# Patient Record
Sex: Female | Born: 1995 | Hispanic: No | Marital: Married | Smoking: Never smoker
Health system: Southern US, Community
[De-identification: ages and names within clinical notes are randomized; demographics above are authoritative.]

## PROBLEM LIST (undated history)

## (undated) DIAGNOSIS — N941 Unspecified dyspareunia: Secondary | ICD-10-CM

## (undated) DIAGNOSIS — R102 Pelvic and perineal pain: Secondary | ICD-10-CM

## (undated) DIAGNOSIS — N930 Postcoital and contact bleeding: Secondary | ICD-10-CM

## (undated) DIAGNOSIS — R0789 Other chest pain: Secondary | ICD-10-CM

## (undated) DIAGNOSIS — Z789 Other specified health status: Secondary | ICD-10-CM

## (undated) DIAGNOSIS — N898 Other specified noninflammatory disorders of vagina: Secondary | ICD-10-CM

## (undated) DIAGNOSIS — R300 Dysuria: Secondary | ICD-10-CM

## (undated) DIAGNOSIS — O9102 Infection of nipple associated with the puerperium: Secondary | ICD-10-CM

## (undated) DIAGNOSIS — K5900 Constipation, unspecified: Secondary | ICD-10-CM

## (undated) DIAGNOSIS — B3789 Other sites of candidiasis: Secondary | ICD-10-CM

## (undated) DIAGNOSIS — N952 Postmenopausal atrophic vaginitis: Secondary | ICD-10-CM

## (undated) HISTORY — DX: Pelvic and perineal pain: R10.2

## (undated) HISTORY — DX: Other sites of candidiasis: B37.89

## (undated) HISTORY — DX: Postcoital and contact bleeding: N93.0

## (undated) HISTORY — DX: Infection of nipple associated with the puerperium: O91.02

## (undated) HISTORY — DX: Other specified noninflammatory disorders of vagina: N89.8

## (undated) HISTORY — DX: Constipation, unspecified: K59.00

## (undated) HISTORY — DX: Unspecified dyspareunia: N94.10

## (undated) HISTORY — DX: Dysuria: R30.0

## (undated) HISTORY — DX: Postmenopausal atrophic vaginitis: N95.2

## (undated) HISTORY — PX: WISDOM TOOTH EXTRACTION: SHX21

## (undated) HISTORY — DX: Other chest pain: R07.89

## (undated) HISTORY — PX: DIAGNOSTIC LAPAROSCOPY WITH REMOVAL OF ECTOPIC PREGNANCY: SHX6449

---

## 2017-03-05 LAB — OB RESULTS CONSOLE GC/CHLAMYDIA: GC PROBE AMP, GENITAL: NEGATIVE

## 2017-04-26 LAB — OB RESULTS CONSOLE HEPATITIS B SURFACE ANTIGEN: Hepatitis B Surface Ag: NEGATIVE

## 2017-04-26 LAB — OB RESULTS CONSOLE RUBELLA ANTIBODY, IGM: Rubella: IMMUNE

## 2017-04-26 LAB — OB RESULTS CONSOLE HIV ANTIBODY (ROUTINE TESTING): HIV: NONREACTIVE

## 2017-06-11 NOTE — L&D Delivery Note (Signed)
Delivery Note At 6:44 PM Alice viable and healthy female was delivered via Vaginal, Spontaneous (Presentation: vtx  ; ROA ).  APGAR: 8, 9; weight  7lb 0.7 oz.   Placenta status: spontaneous intact not sent. mec stained, .  Cord:  with the following complications: loop of cord next to chest .  Cord pH: none Anesthesia:  epidural Episiotomy: None Lacerations: right Vaginal;2nd degree perineal Suture Repair: 3.0 chromic Est. Blood Loss (mL): 300  Mom to postpartum.  Baby to Couplet care / Skin to Skin.  Alice [name] Alice [name] 11/13/2017, 7:49 PM

## 2017-08-30 LAB — OB RESULTS CONSOLE RPR: RPR: NONREACTIVE

## 2017-10-25 LAB — OB RESULTS CONSOLE GBS: STREP GROUP B AG: NEGATIVE

## 2017-11-13 ENCOUNTER — Inpatient Hospital Stay (HOSPITAL_COMMUNITY): Payer: PPO | Admitting: Anesthesiology

## 2017-11-13 ENCOUNTER — Inpatient Hospital Stay (HOSPITAL_COMMUNITY)
Admission: AD | Admit: 2017-11-13 | Discharge: 2017-11-15 | DRG: 807 | Disposition: A | Discharge status: Home / Self Care | Payer: PPO | Attending: Obstetrics and Gynecology | Admitting: Obstetrics and Gynecology

## 2017-11-13 ENCOUNTER — Encounter (HOSPITAL_COMMUNITY): Payer: Self-pay | Admitting: *Deleted

## 2017-11-13 ENCOUNTER — Other Ambulatory Visit: Payer: Self-pay

## 2017-11-13 DIAGNOSIS — Z3483 Encounter for supervision of other normal pregnancy, third trimester: Secondary | ICD-10-CM | POA: Diagnosis present

## 2017-11-13 DIAGNOSIS — Z3A39 39 weeks gestation of pregnancy: Secondary | ICD-10-CM | POA: Diagnosis not present

## 2017-11-13 DIAGNOSIS — O9902 Anemia complicating childbirth: Secondary | ICD-10-CM | POA: Diagnosis present

## 2017-11-13 LAB — CBC
HCT: 34.7 % — ABNORMAL LOW (ref 36.0–46.0)
Hemoglobin: 11.6 g/dL — ABNORMAL LOW (ref 12.0–15.0)
MCH: 28.1 pg (ref 26.0–34.0)
MCHC: 33.4 g/dL (ref 30.0–36.0)
MCV: 84 fL (ref 78.0–100.0)
PLATELETS: 223 10*3/uL (ref 150–400)
RBC: 4.13 MIL/uL (ref 3.87–5.11)
RDW: 14.9 % (ref 11.5–15.5)
WBC: 8.1 10*3/uL (ref 4.0–10.5)

## 2017-11-13 LAB — ABO/RH: ABO/RH(D): A POS

## 2017-11-13 LAB — URIC ACID: Uric Acid, Serum: 6 mg/dL (ref 2.3–6.6)

## 2017-11-13 LAB — RPR: RPR: NONREACTIVE

## 2017-11-13 LAB — TYPE AND SCREEN
ABO/RH(D): A POS
Antibody Screen: NEGATIVE

## 2017-11-13 MED ORDER — OXYTOCIN 40 UNITS IN LACTATED RINGERS INFUSION - SIMPLE MED
2.5000 [IU]/h | INTRAVENOUS | Status: DC
  Filled 2017-11-13: qty 1000

## 2017-11-13 MED ORDER — OXYTOCIN BOLUS FROM INFUSION
500.0000 mL | Freq: Once | INTRAVENOUS | Status: AC
  Administered 2017-11-13: 500 mL via INTRAVENOUS

## 2017-11-13 MED ORDER — LACTATED RINGERS IV SOLN
500.0000 mL | INTRAVENOUS | Status: DC | PRN
  Administered 2017-11-13 (×2): 500 mL via INTRAVENOUS

## 2017-11-13 MED ORDER — EPHEDRINE 5 MG/ML INJ
10.0000 mg | INTRAVENOUS | Status: DC | PRN
  Filled 2017-11-13: qty 2

## 2017-11-13 MED ORDER — ACETAMINOPHEN 325 MG PO TABS: 650.0000 mg | ORAL_TABLET | ORAL | Status: DC | PRN

## 2017-11-13 MED ORDER — ACETAMINOPHEN 325 MG PO TABS
650.0000 mg | ORAL_TABLET | ORAL | Status: DC | PRN
  Administered 2017-11-14: 650 mg via ORAL
  Filled 2017-11-13: qty 2

## 2017-11-13 MED ORDER — LACTATED RINGERS IV SOLN
500.0000 mL | Freq: Once | INTRAVENOUS | Status: AC
  Administered 2017-11-13: 500 mL via INTRAVENOUS

## 2017-11-13 MED ORDER — OXYCODONE HCL 5 MG PO TABS: 5.0000 mg | ORAL_TABLET | ORAL | Status: DC | PRN

## 2017-11-13 MED ORDER — DIPHENHYDRAMINE HCL 50 MG/ML IJ SOLN: 12.5000 mg | INTRAMUSCULAR | Status: DC | PRN

## 2017-11-13 MED ORDER — PHENYLEPHRINE 40 MCG/ML (10ML) SYRINGE FOR IV PUSH (FOR BLOOD PRESSURE SUPPORT)
PREFILLED_SYRINGE | INTRAVENOUS | Status: AC
  Filled 2017-11-13: qty 20

## 2017-11-13 MED ORDER — PHENYLEPHRINE 40 MCG/ML (10ML) SYRINGE FOR IV PUSH (FOR BLOOD PRESSURE SUPPORT)
80.0000 ug | PREFILLED_SYRINGE | INTRAVENOUS | Status: DC | PRN
  Filled 2017-11-13: qty 5

## 2017-11-13 MED ORDER — DIPHENHYDRAMINE HCL 25 MG PO CAPS: 25.0000 mg | ORAL_CAPSULE | Freq: Four times a day (QID) | ORAL | Status: DC | PRN

## 2017-11-13 MED ORDER — SOD CITRATE-CITRIC ACID 500-334 MG/5ML PO SOLN: 30.0000 mL | ORAL | Status: DC | PRN

## 2017-11-13 MED ORDER — FENTANYL 2.5 MCG/ML BUPIVACAINE 1/10 % EPIDURAL INFUSION (WH - ANES)
INTRAMUSCULAR | Status: AC
  Filled 2017-11-13: qty 100

## 2017-11-13 MED ORDER — TERBUTALINE SULFATE 1 MG/ML IJ SOLN
0.2500 mg | Freq: Once | INTRAMUSCULAR | Status: DC | PRN
  Filled 2017-11-13: qty 1

## 2017-11-13 MED ORDER — SENNOSIDES-DOCUSATE SODIUM 8.6-50 MG PO TABS
2.0000 | ORAL_TABLET | ORAL | Status: DC
  Administered 2017-11-14 (×2): 2 via ORAL
  Filled 2017-11-13 (×2): qty 2

## 2017-11-13 MED ORDER — OXYCODONE HCL 5 MG PO TABS: 10.0000 mg | ORAL_TABLET | ORAL | Status: DC | PRN

## 2017-11-13 MED ORDER — OXYTOCIN 40 UNITS IN LACTATED RINGERS INFUSION - SIMPLE MED
1.0000 m[IU]/min | INTRAVENOUS | Status: DC
  Administered 2017-11-13: 2 m[IU]/min via INTRAVENOUS

## 2017-11-13 MED ORDER — ZOLPIDEM TARTRATE 5 MG PO TABS: 5.0000 mg | ORAL_TABLET | Freq: Every evening | ORAL | Status: DC | PRN

## 2017-11-13 MED ORDER — SIMETHICONE 80 MG PO CHEW: 80.0000 mg | CHEWABLE_TABLET | ORAL | Status: DC | PRN

## 2017-11-13 MED ORDER — COCONUT OIL OIL
1.0000 "application " | TOPICAL_OIL | Status: DC | PRN
  Filled 2017-11-13: qty 120

## 2017-11-13 MED ORDER — LIDOCAINE HCL (PF) 1 % IJ SOLN
30.0000 mL | INTRAMUSCULAR | Status: AC | PRN
  Administered 2017-11-13: 30 mL via SUBCUTANEOUS
  Filled 2017-11-13: qty 30

## 2017-11-13 MED ORDER — FENTANYL 2.5 MCG/ML BUPIVACAINE 1/10 % EPIDURAL INFUSION (WH - ANES)
14.0000 mL/h | INTRAMUSCULAR | Status: DC | PRN
  Administered 2017-11-13 (×2): 14 mL/h via EPIDURAL
  Filled 2017-11-13: qty 100

## 2017-11-13 MED ORDER — LIDOCAINE HCL (PF) 1 % IJ SOLN
INTRAMUSCULAR | Status: DC | PRN
  Administered 2017-11-13: 8 mL via EPIDURAL

## 2017-11-13 MED ORDER — IBUPROFEN 600 MG PO TABS
600.0000 mg | ORAL_TABLET | Freq: Four times a day (QID) | ORAL | Status: DC
  Administered 2017-11-13 – 2017-11-15 (×8): 600 mg via ORAL
  Filled 2017-11-13 (×9): qty 1

## 2017-11-13 MED ORDER — PRENATAL MULTIVITAMIN CH
1.0000 | ORAL_TABLET | Freq: Every day | ORAL | Status: DC
  Administered 2017-11-14 – 2017-11-15 (×2): 1 via ORAL
  Filled 2017-11-13 (×2): qty 1

## 2017-11-13 MED ORDER — OXYCODONE-ACETAMINOPHEN 5-325 MG PO TABS: 2.0000 | ORAL_TABLET | ORAL | Status: DC | PRN

## 2017-11-13 MED ORDER — FERROUS SULFATE 325 (65 FE) MG PO TABS
325.0000 mg | ORAL_TABLET | Freq: Two times a day (BID) | ORAL | Status: DC
  Administered 2017-11-14: 325 mg via ORAL
  Filled 2017-11-13: qty 1

## 2017-11-13 MED ORDER — BENZOCAINE-MENTHOL 20-0.5 % EX AERO
1.0000 "application " | INHALATION_SPRAY | CUTANEOUS | Status: DC | PRN
  Administered 2017-11-14: 1 via TOPICAL
  Filled 2017-11-13 (×2): qty 56

## 2017-11-13 MED ORDER — OXYCODONE-ACETAMINOPHEN 5-325 MG PO TABS: 1.0000 | ORAL_TABLET | ORAL | Status: DC | PRN

## 2017-11-13 MED ORDER — WITCH HAZEL-GLYCERIN EX PADS: 1.0000 "application " | MEDICATED_PAD | CUTANEOUS | Status: DC | PRN

## 2017-11-13 MED ORDER — ONDANSETRON HCL 4 MG/2ML IJ SOLN: 4.0000 mg | INTRAMUSCULAR | Status: DC | PRN

## 2017-11-13 MED ORDER — ONDANSETRON HCL 4 MG PO TABS: 4.0000 mg | ORAL_TABLET | ORAL | Status: DC | PRN

## 2017-11-13 MED ORDER — LACTATED RINGERS IV SOLN
INTRAVENOUS | Status: DC
  Administered 2017-11-13 (×4): via INTRAVENOUS

## 2017-11-13 MED ORDER — DIBUCAINE 1 % RE OINT
1.0000 "application " | TOPICAL_OINTMENT | RECTAL | Status: DC | PRN
  Filled 2017-11-13: qty 28

## 2017-11-13 NOTE — Anesthesia Pain Management Evaluation Note (Signed)
  CRNA Pain Management Visit Note  Patient: Alice [name], 22 y.o., female  "Hello I am a member of the anesthesia team at [facility]. We have an anesthesia team available at all times to provide care throughout the hospital, including epidural management and anesthesia for C-section. I don't know your plan for the delivery whether it a natural birth, water birth, IV sedation, nitrous supplementation, doula or epidural, but we want to meet your pain goals."   1.Was your pain managed to your expectations on prior hospitalizations?   No prior hospitalizations  2.What is your expectation for pain management during this hospitalization?     Epidural  3.How can we help you reach that goal?   Record the patient's initial score and the patient's pain goal.   Pain: 8  Pain Goal: 3 [facility] wants you to be able to say your pain was always managed very well.  Alice [name],Alice [name] 11/13/2017

## 2017-11-13 NOTE — Anesthesia Preprocedure Evaluation (Signed)

## 2017-11-13 NOTE — MAU Note (Signed)
PT SAYS  UC STRONG AT 0200.    VE IN OFFICE-   2 CM YESTERDAY.   GBS- NEG.   DENIES HSV.

## 2017-11-13 NOTE — Anesthesia Procedure Notes (Signed)
Epidural Patient location during procedure: OB Start time: 11/13/2017 7:50 AM End time: 11/13/2017 7:54 AM  Staffing Anesthesiologist: Bethena Midgetddono, Elisandro Jarrett, MD  Preanesthetic Checklist Completed: patient identified, site marked, surgical consent, pre-op evaluation, timeout performed, IV checked, risks and benefits discussed and monitors and equipment checked  Epidural Patient position: sitting Prep: site prepped and draped and DuraPrep Patient monitoring: continuous pulse ox and blood pressure Approach: midline Location: L4-L5 Injection technique: LOR air  Needle:  Needle type: Tuohy  Needle gauge: 17 G Needle length: 9 cm and 9 Needle insertion depth: 5 cm Catheter type: closed end flexible Catheter size: 19 Gauge Catheter at skin depth: 10 cm Test dose: negative  Assessment Events: blood not aspirated, injection not painful, no injection resistance, negative IV test and no paresthesia

## 2017-11-13 NOTE — Progress Notes (Signed)
Alice [name] is a 22 y.o. G1P0 at 331w2d by sono.   Subjective: UCs painful since 2 am   Objective: BP (!) 122/53   Pulse 80   Temp 98.8 F (37.1 C) (Oral)   Resp 16   Ht 5\' 2"  (1.575 m)   Wt 139 lb 4 oz (63.2 kg)   BMI 25.47 kg/m   FHT:  FHR: 150 bpm, variability: moderate,  accelerations:  Present,  decelerations:  Absent UC:   regular, every 3-4 minutes SVE:   Dilation: 6 Effacement (%): 50 Station: -3 Exam by:: Dr. Juliene PinaMody Controlled AROM, clear fluid, high unflexed head  Labs: Lab Results  Component Value Date   WBC 8.1 11/13/2017   HGB 11.6 (L) 11/13/2017   HCT 34.7 (L) 11/13/2017   MCV 84.0 11/13/2017   PLT 223 11/13/2017    Assessment / Plan: Spontaneous labor, progressing normally  Labor: Progressing normally Fetal Wellbeing:  Category I Pain Control:  Epidural per request  I/D:  n/a Anticipated MOD:  NSVD, guarded until head descent noted.   Alice [name] 11/13/2017, 7:14 AM

## 2017-11-13 NOTE — Progress Notes (Signed)
Alice [name] is a 22 y.o. G1P0 at 4523w2d by sono.   Subjective: S/p epidural   Objective: BP 113/67   Pulse 76   Temp 98.5 F (36.9 C) (Oral)   Resp 16   Ht 5\' 2"  (1.575 m)   Wt 139 lb 4 oz (63.2 kg)   SpO2 98%   BMI 25.47 kg/m   FHT:  FHR: 150 bpm, variability: moderate,  accelerations:  Present,  decelerations:  Absent UC:   regular, every 3-4 minutes SVE:   Dilation: 6 Effacement (%): 80 Station: -2 Exam by:: A.Anthony,RN Unchanged exam Left rotation.   Assessment / Plan: Spontaneous labor, progressing normally  Labor: Progressing normally Fetal Wellbeing:  Category I Pain Control:  Epidural I/D:  n/a Anticipated MOD:  NSVD, guarded until head descent noted.   Alice [name] 11/13/2017, 8:40 AM

## 2017-11-13 NOTE — H&P (Signed)
Alice [name] is a 22 y.o. female G1 39.2 wks. Presents for labor. Bloody show. GBS(-) Uncomplicated prenatal care, from 10 wks. Labs normal.  . OB History    Gravida  1   Para      Term      Preterm      AB      Living        SAB      TAB      Ectopic      Multiple      Live Births             History reviewed. No pertinent past medical history. Past Surgical History:  Procedure Laterality Date  . WISDOM TOOTH EXTRACTION     Family History: family history includes Diabetes in her maternal grandmother. Social History:  reports that she has never smoked. She has never used smokeless tobacco. She reports that she does not drink alcohol or use drugs.     Maternal Diabetes: No Genetic Screening: Normal Maternal Ultrasounds/Referrals: Normal Fetal Ultrasounds or other Referrals:  None Maternal Substance Abuse:  No Significant Maternal Medications:  None Significant Maternal Lab Results:  Lab values include: Group B Strep negative Other Comments:  None  ROS History Dilation: 5 Effacement (%): 50 Station: -3 Exam by:: Everlene FarrierHilary Hardy, RN  Blood pressure 121/74, pulse 86, temperature 98.5 F (36.9 C), resp. rate 16, height 5\' 2"  (1.575 m), weight 139 lb 4 oz (63.2 kg). Exam Physical Exam  BP 121/74 (BP Location: Right Arm)   Pulse 86   Temp 98.5 F (36.9 C)   Resp 16   Ht 5\' 2"  (1.575 m)   Wt 139 lb 4 oz (63.2 kg)   BMI 25.47 kg/m  Physical exam:  A&O x 3, no acute distress. Pleasant HEENT neg, no thyromegaly Lungs CTA bilat CV RRR, S1S2 normal Abdo soft, non tender, non acute Extr no edema/ tenderness Pelvic above 5/80%/-3/ Vx/ intact, bloody show FHT  130s + accels no decels mod variability category I Toco q 3-4 min  Prenatal labs: ABO, Rh:  A(+) Antibody:  Neg Rubella:  Imm RPR:   NR HBsAg:   Neg HIV:   Neg GBS:   Neg Glucola neg Ultrascreen neg   Assessment/Plan: 22 yo female G1, 39.2 wks, in active labor. FHT cat I Routine labor  admit. CEFM, ambulate, plan AROM after admit to L&D, epidural desired. Anticipate SVD  Robley FriesVaishali R Ghalia Reicks 11/13/2017, 5:21 AM

## 2017-11-13 NOTE — Progress Notes (Signed)
Alice [name] is a 22 y.o. G1P0 at 102110w2d by LMP admitted for active labor  Subjective: Chief Complaint  Patient presents with  . Labor Eval  S: epidural Notes ctx  Objective: BP 107/61   Pulse 85   Temp 99.8 F (37.7 C) (Oral)   Resp 16   Ht 5\' 2"  (1.575 m)   Wt 63.2 kg (139 lb 4 oz)   SpO2 98%   BMI 25.47 kg/m  No intake/output data recorded. No intake/output data recorded.  FHT:  FHR: 140 bpm, variability: moderate,  accelerations:  Present,  decelerations:  Absent UC:   ? Frequency ( q2-4 mins) SVE:   9/100/-2/-1 asynclitic small caput Tracing: cat 1 IUPC placed Labs: Lab Results  Component Value Date   WBC 8.1 11/13/2017   HGB 11.6 (L) 11/13/2017   HCT 34.7 (L) 11/13/2017   MCV 84.0 11/13/2017   PLT 223 11/13/2017    Assessment / Plan: Arrest in active phase of labor  Unclear if related to sub optimal ctx. Pt and husband frustrated due to same exam for 4 hrs per husband Advised if ctx optimal for 2 hr and no change, then  Need C/S. If poor ctx then will start pitocin and reexamine After adequate x 2 hr P) right exaggerated sims. Start pitocin if sub optimal ctx . Recheck 2hr after adequate labor   Anticipated MOD:  guarded  Ghassan Coggeshall A Delawrence Fridman 11/13/2017, 3:17 PM

## 2017-11-14 ENCOUNTER — Encounter (HOSPITAL_COMMUNITY): Payer: Self-pay | Admitting: Student

## 2017-11-14 DIAGNOSIS — O9902 Anemia complicating childbirth: Secondary | ICD-10-CM

## 2017-11-14 HISTORY — DX: Anemia complicating childbirth: O99.02

## 2017-11-14 LAB — CBC
HEMATOCRIT: 27.9 % — AB (ref 36.0–46.0)
HEMOGLOBIN: 9.3 g/dL — AB (ref 12.0–15.0)
MCH: 28.1 pg (ref 26.0–34.0)
MCHC: 33.3 g/dL (ref 30.0–36.0)
MCV: 84.3 fL (ref 78.0–100.0)
Platelets: 158 10*3/uL (ref 150–400)
RBC: 3.31 MIL/uL — AB (ref 3.87–5.11)
RDW: 15 % (ref 11.5–15.5)
WBC: 16.5 10*3/uL — ABNORMAL HIGH (ref 4.0–10.5)

## 2017-11-14 MED ORDER — MAGNESIUM OXIDE 400 (241.3 MG) MG PO TABS
400.0000 mg | ORAL_TABLET | Freq: Every day | ORAL | Status: DC
  Administered 2017-11-14 – 2017-11-15 (×2): 400 mg via ORAL
  Filled 2017-11-14 (×3): qty 1

## 2017-11-14 MED ORDER — POLYSACCHARIDE IRON COMPLEX 150 MG PO CAPS
150.0000 mg | ORAL_CAPSULE | Freq: Every day | ORAL | Status: DC
  Administered 2017-11-15: 150 mg via ORAL
  Filled 2017-11-14: qty 1

## 2017-11-14 NOTE — Lactation Note (Signed)
This note was copied from a baby's chart. Lactation Consultation Note  Patient Name: Alice [name] Reason for consult: Follow-up assessment;1st time breastfeeding   Follow up with mom of 21 hours at RN request. Infant has been sleepy today and not feeding actively. Infant with 5 BF for 10-20 minutes, 3 BF attempts, 1 void and 4 stools since birth. Infant weight 6 pounds 13.5 ounces with 3% weight loss since birth.   RN had infant latched to the left breast in the football hold. Infant actively feeding with good swallows. Infant needed some stimulation as feeding progressed. Enc mom to massage/compress breast with feedings as well as stimulating infant as needed to maintain suckling. Mom very tired and needed reminding about keeping infant awake with feeding. Enc mom to sleep when infant sleeps. Infant self detached and nipple was round post BF. Mom denied nipple pain with feeding, she was experiencing uterine cramping with feeding.   Enc mom to feed infant STS 8-12 x in 24 hours at first feeding cues. Discussed normalcy of cluster feeding and would be likely that infant will cluster feed tonight.  Enc mom to call out for feeding assistance as needed. Dad concerned infant spitting up to an hour post feeding. Enc parents to burp infant post each feeding and may be helpful to keep infant upright post feeding. Dad is concerned infant is fragile and is better off laying flat, Discussed it is ok to hold infant upright after feeding.   Parents report all questions/concerns have been answered. Parents to call out for feeding assistance as needed.      Maternal Data Formula Feeding for Exclusion: No Does the patient have breastfeeding experience prior to this delivery?: No  Feeding Feeding Type: Breast Fed Length of feed: 20 min  LATCH Score Latch: Repeated attempts needed to sustain latch, nipple held in mouth throughout feeding, stimulation needed to elicit sucking  reflex.  Audible Swallowing: Spontaneous and intermittent  Type of Nipple: Everted at rest and after stimulation  Comfort (Breast/Nipple): Soft / non-tender  Hold (Positioning): Assistance needed to correctly position infant at breast and maintain latch.  LATCH Score: 8  Interventions    Lactation Tools Discussed/Used     Consult Status Consult Status: Follow-up Date: 11/15/17 Follow-up type: In-patient    Alice [name] [name], 4:37 PM

## 2017-11-14 NOTE — Lactation Note (Signed)
This note was copied from a baby's chart. Lactation Consultation Note Baby 9 hrs old. Baby hasn't fed much.  Mom has med/large nipples. Short shaft everted nipples. Everts more w/stimulation. Areola w/slight edema. Reverse pressure to soften and compressible. Shells given and placed in bra. Hand pump given to pre-or post pump. Mom has large cone shaped breast. Feels heavy to LC, mom states no different. Discussed filling, engorgement, pumping, hand expression, milk storage.  Baby resting in crib, acting like will have emesis. Sat baby upright. No emesis. Discussed newborn behavior, feeding habits, I&O, STS, cluster feeding, supply and demand. Noted baby has recessed chin. Discussed chin tug after latching. Mom encouraged to feed baby 8-12 times/24 hours and with feeding cues. Mom encouraged to waken baby for feeds if hasn't cued in 3 hrs. Answered a lot of questions parents had. encouraged to call for assistance or further questions.  WH/LC brochure given w/resources, support groups and LC services.  Patient Name: Boy Luz LexRazan Neyens ZOXWR'UToday's Date: 11/14/2017 Reason for consult: Initial assessment   Maternal Data Has patient been taught Hand Expression?: Yes Does the patient have breastfeeding experience prior to this delivery?: No  Feeding Feeding Type: Breast Fed  LATCH Score Latch: Too sleepy or reluctant, no latch achieved, no sucking elicited.  Audible Swallowing: None  Type of Nipple: Everted at rest and after stimulation  Comfort (Breast/Nipple): Soft / non-tender  Hold (Positioning): Assistance needed to correctly position infant at breast and maintain latch.  LATCH Score: 6  Interventions Interventions: Breast feeding basics reviewed;Support pillows;Skin to skin;Expressed milk;Breast massage;Hand express;Shells;Pre-pump if needed;Reverse pressure;Hand pump;Breast compression  Lactation Tools Discussed/Used Tools: Shells;Pump Shell Type: Inverted Breast pump type:  Manual WIC Program: No Pump Review: Setup, frequency, and cleaning;Milk Storage Initiated by:: Peri JeffersonL. Aleea Hendry RNIBCLC Date initiated:: 11/14/17   Consult Status Consult Status: Follow-up Date: 11/14/17 Follow-up type: In-patient    Dymond Gutt, Diamond NickelLAURA G 11/14/2017, 3:51 AM

## 2017-11-14 NOTE — Progress Notes (Signed)
PPD # 1 SVD Information for the patient's newborn:  Alice [name], Boy Alice [name] [[id number]][[id number]]  female      breast feeding  / Circumcision undecided Baby name: undecided  S:  Reports feeling tired but well.             Tolerating po/ No nausea or vomiting             Bleeding is decreased             Pain controlled with ibuprofen (OTC)             Up ad lib / ambulatory / voiding without difficulties        O:  A & O x 3, in no apparent distress              VS:  Vitals:   11/13/17 2148 11/13/17 2237 11/14/17 0215 11/14/17 0815  BP: 113/66 102/74 106/60 (!) 98/48  Pulse: 79 (!) 103 70 75  Resp: 18 17 16 18   Temp: 98.9 F (37.2 C) 99.6 F (37.6 C) 98.2 F (36.8 C) 98 F (36.7 C)  TempSrc: Oral Oral Oral Oral  SpO2: 100% 99% 100%   Weight:      Height:        LABS:  Recent Labs    11/13/17 0536 11/14/17 0609  WBC 8.1 16.5*  HGB 11.6* 9.3*  HCT 34.7* 27.9*  PLT 223 158    Blood type: --/--/A POS, A POS Performed at [facility], [address] [place], [place] [address]  [phone](06/05 [id number])  Rubella: Immune (11/16 0000)   I&O: I/O last 3 completed shifts: In: -  Out: 1100 [Urine:800; Blood:300]          No intake/output data recorded.  Lungs: Clear and unlabored  Heart: regular rate and rhythm / no murmurs  Abdomen: soft, non-tender, non-distended             Fundus: firm, non-tender, U-1  Perineum: repair intact  Lochia: small  Extremities: no edema, no calf pain or tenderness    A/P: PPD # 1 22 y.o., G1P1001   Principal Problem:   Postpartum care following vaginal delivery (6/5) Active Problems:   SVD (spontaneous vaginal delivery)   Second-degree perineal laceration, with delivery   Doing well - stable status  Routine post partum orders  Anticipate discharge tomorrow    Alice Mendsaniela C Makynleigh Breslin, MSN, CNM 11/14/2017, 1:33 PM

## 2017-11-14 NOTE — Anesthesia Postprocedure Evaluation (Signed)
Anesthesia Post Note  Patient: Alice [name]  Procedure(s) Performed: AN AD HOC LABOR EPIDURAL     Patient location during evaluation: Mother Baby Anesthesia Type: Epidural Level of consciousness: awake and alert and oriented Pain management: satisfactory to patient Vital Signs Assessment: post-procedure vital signs reviewed and stable Respiratory status: spontaneous breathing and nonlabored ventilation Cardiovascular status: stable Postop Assessment: no headache, no backache, no signs of nausea or vomiting, adequate PO intake and patient able to bend at knees (patient up walking) Anesthetic complications: no    Last Vitals:  Vitals:   11/14/17 0215 11/14/17 0815  BP: 106/60 (!) 98/48  Pulse: 70 75  Resp: 16 18  Temp: 36.8 C 36.7 C  SpO2: 100%     Last Pain:  Vitals:   11/14/17 0815  TempSrc: Oral  PainSc: 0-No pain   Pain Goal: Patients Stated Pain Goal: 6 (11/13/17 0550)               Madison HickmanGREGORY,Surya Schroeter

## 2017-11-15 MED ORDER — COCONUT OIL OIL: 1.0000 "application " | TOPICAL_OIL | 0 refills | Status: DC | PRN

## 2017-11-15 MED ORDER — DIBUCAINE 1 % RE OINT: 1.0000 "application " | TOPICAL_OINTMENT | RECTAL | 0 refills | Status: DC | PRN

## 2017-11-15 MED ORDER — WITCH HAZEL-GLYCERIN EX PADS: 1.0000 | MEDICATED_PAD | CUTANEOUS | 12 refills | Status: DC | PRN

## 2017-11-15 MED ORDER — IBUPROFEN 600 MG PO TABS: 600.0000 mg | ORAL_TABLET | Freq: Four times a day (QID) | ORAL | 0 refills | Status: DC

## 2017-11-15 NOTE — Discharge Instructions (Signed)
Postpartum Care After Vaginal Delivery °The period of time right after you deliver your newborn is called the postpartum period. °What kind of medical care will I receive? °· You may continue to receive fluids and medicines through an IV tube inserted into one of your veins. °· If an incision was made near your vagina (episiotomy) or if you had some vaginal tearing during delivery, cold compresses may be placed on your episiotomy or your tear. This helps to reduce pain and swelling. °· You may be given a squirt bottle to use when you go to the bathroom. You may use this until you are comfortable wiping as usual. To use the squirt bottle, follow these steps: °? Before you urinate, fill the squirt bottle with warm water. Do not use hot water. °? After you urinate, while you are sitting on the toilet, use the squirt bottle to rinse the area around your urethra and vaginal opening. This rinses away any urine and blood. °? You may do this instead of wiping. As you start healing, you may use the squirt bottle before wiping yourself. Make sure to wipe gently. °? Fill the squirt bottle with clean water every time you use the bathroom. °· You will be given sanitary pads to wear. °How can I expect to feel? °· You may not feel the need to urinate for several hours after delivery. °· You will have some soreness and pain in your abdomen and vagina. °· If you are breastfeeding, you may have uterine contractions every time you breastfeed for up to several weeks postpartum. Uterine contractions help your uterus return to its normal size. °· It is normal to have vaginal bleeding (lochia) after delivery. The amount and appearance of lochia is often similar to a menstrual period in the first week after delivery. It will gradually decrease over the next few weeks to a dry, yellow-brown discharge. For most women, lochia stops completely by 6-8 weeks after delivery. Vaginal bleeding can vary from woman to woman. °· Within the first few  days after delivery, you may have breast engorgement. This is when your breasts feel heavy, full, and uncomfortable. Your breasts may also throb and feel hard, tightly stretched, warm, and tender. After this occurs, you may have milk leaking from your breasts. Your health care provider can help you relieve discomfort due to breast engorgement. Breast engorgement should go away within a few days. °· You may feel more sad or worried than normal due to hormonal changes after delivery. These feelings should not last more than a few days. If these feelings do not go away after several days, speak with your health care provider. °How should I care for myself? °· Tell your health care provider if you have pain or discomfort. °· Drink enough water to keep your urine clear or pale yellow. °· Wash your hands thoroughly with soap and water for at least 20 seconds after changing your sanitary pads, after using the toilet, and before holding or feeding your baby. °· If you are not breastfeeding, avoid touching your breasts a lot. Doing this can make your breasts produce more milk. °· If you become weak or lightheaded, or you feel like you might faint, ask for help before: °? Getting out of bed. °? Showering. °· Change your sanitary pads frequently. Watch for any changes in your flow, such as a sudden increase in volume, a change in color, the passing of large blood clots. If you pass a blood clot from your vagina, save it   to show to your health care provider. Do not flush blood clots down the toilet without having your health care provider look at them. °· Make sure that all your vaccinations are up to date. This can help protect you and your baby from getting certain diseases. You may need to have immunizations done before you leave the hospital. °· If desired, talk with your health care provider about methods of family planning or birth control (contraception). °How can I start bonding with my baby? °Spending as much time as  possible with your baby is very important. During this time, you and your baby can get to know each other and develop a bond. Having your baby stay with you in your room (rooming in) can give you time to get to know your baby. Rooming in can also help you become comfortable caring for your baby. Breastfeeding can also help you bond with your baby. °How can I plan for returning home with my baby? °· Make sure that you have a car seat installed in your vehicle. °? Your car seat should be checked by a certified car seat installer to make sure that it is installed safely. °? Make sure that your baby fits into the car seat safely. °· Ask your health care provider any questions you have about caring for yourself or your baby. Make sure that you are able to contact your health care provider with any questions after leaving the hospital. °This information is not intended to replace advice given to you by your health care provider. Make sure you discuss any questions you have with your health care provider. °Document Released: 03/25/2007 Document Revised: 10/31/2015 Document Reviewed: 05/02/2015 °Elsevier Interactive Patient Education © 2018 [employer] ° °

## 2017-11-15 NOTE — Discharge Summary (Signed)
Obstetric Discharge Summary Reason for Admission: onset of labor Prenatal Procedures: ultrasound Intrapartum Procedures: spontaneous vaginal delivery Postpartum Procedures: none Complications-Operative and Postpartum: 2nd degree perineal laceration Hemoglobin  Date Value Ref Range Status  11/14/2017 9.3 (L) 12.0 - 15.0 g/dL Final   HCT  Date Value Ref Range Status  11/14/2017 27.9 (L) 36.0 - 46.0 % Final    Physical Exam:  General: alert, cooperative and appears stated age 32Lochia: appropriate Uterine Fundus: firm Incision: healing well DVT Evaluation: No evidence of DVT seen on physical exam.  Discharge Diagnoses: Term Pregnancy-delivered  Discharge Information: Date: 11/15/2017 Activity: pelvic rest Diet: routine Medications: Prenatal vitamins, iron Condition: stable Instructions: refer to practice specific booklet Discharge to: home Follow-up Information    Alice [name], Alice Bisig, MD Follow up in 4 week(s).   Specialty:  Obstetrics and Gynecology Why:  for postpartum check before you [place] Contact information: [place] [place] [address] [phone]           Newborn Data: Live born female  Birth Weight: 7 lb 0.7 oz (3196 g) APGAR: 8, 9  Newborn Delivery   Birth date/time:  11/13/2017 18:44:00 Delivery type:  Vaginal, Spontaneous     Home with mother.  Alice FriesVaishali R Aayushi [name] 11/15/2017, 4:02 PM

## 2017-11-15 NOTE — Plan of Care (Signed)
Progressing appropriately. Encouraged to call for assistance as needed and LATCH assessment.

## 2017-11-15 NOTE — Progress Notes (Signed)
Post Partum Day 2 Subjective: no complaints, up ad lib, voiding, tolerating PO and + flatus  Objective: Blood pressure 101/61, pulse 75, temperature 98 F (36.7 C), temperature source Oral, resp. rate 18, height 5\' 2"  (1.575 m), weight 139 lb 4 oz (63.2 kg), SpO2 100 %, unknown if currently breastfeeding.  Physical Exam:  General: alert and cooperative Lochia: appropriate Uterine Fundus: firm Incision: healing well DVT Evaluation: No evidence of DVT seen on physical exam.  Recent Labs    11/13/17 0536 11/14/17 0609  HGB 11.6* 9.3*  HCT 34.7* 27.9*    Assessment[facility]: [facility]warning s/s reviewed Circ care discussed F/up 4 wks with Dr Hodges Treiber   LOS: 2 days   Robley FriesVaishali R Dorsey [name] 11/15/2017, 3:57 PM

## 2017-11-15 NOTE — Lactation Note (Signed)
This note was copied from a baby's chart. Lactation Consultation Note; Mom very sleepy- reports baby fed every hour through the night. Reports she just fed him but he is still acting hungry. Assisted mom with latch- baby nursed for 15 more minutes then off to sleep. Encouraged mom to keep baby close to breast throughout the feeding. Encouraged breast massage and compression while he is nursing. Reports no pain with latch. Nipple round when he came off the breast. No questions at present. Reviewed our phone number, OP appointments and BFSG as resources for support after DC. To call prn  Patient Name: Alice [name] UXLKG'MToday's Date: 11/15/2017 Reason for consult: Follow-up assessment   Maternal Data Formula Feeding for Exclusion: No Has patient been taught Hand Expression?: Yes Does the patient have breastfeeding experience prior to this delivery?: No  Feeding Feeding Type: Breast Fed Length of feed: 15 min  LATCH Score Latch: Grasps breast easily, tongue down, lips flanged, rhythmical sucking.  Audible Swallowing: A few with stimulation  Type of Nipple: Everted at rest and after stimulation  Comfort (Breast/Nipple): Soft / non-tender  Hold (Positioning): Assistance needed to correctly position infant at breast and maintain latch.  LATCH Score: 8  Interventions Interventions: Breast feeding basics reviewed;Assisted with latch;Breast compression  Lactation Tools Discussed/Used Tools: Shells Shell Type: Inverted Breast pump type: Manual   Consult Status Consult Status: Complete    Pamelia HoitWeeks, Rolen Conger D 11/15/2017, 8:10 AM

## 2019-04-28 LAB — OB RESULTS CONSOLE HEPATITIS B SURFACE ANTIGEN: Hepatitis B Surface Ag: NEGATIVE

## 2019-04-28 LAB — OB RESULTS CONSOLE GC/CHLAMYDIA
Chlamydia: NEGATIVE
Gonorrhea: NEGATIVE

## 2019-04-28 LAB — OB RESULTS CONSOLE HIV ANTIBODY (ROUTINE TESTING): HIV: NONREACTIVE

## 2019-04-28 LAB — OB RESULTS CONSOLE RUBELLA ANTIBODY, IGM: Rubella: IMMUNE

## 2019-04-30 ENCOUNTER — Other Ambulatory Visit: Payer: Self-pay

## 2019-04-30 DIAGNOSIS — Z20822 Contact with and (suspected) exposure to covid-19: Secondary | ICD-10-CM

## 2019-05-02 LAB — NOVEL CORONAVIRUS, NAA: SARS-CoV-2, NAA: NOT DETECTED

## 2019-06-12 NOTE — L&D Delivery Note (Signed)
Delivery Note At 2239 a viable and healthy female was delivered over intact perineum via svd (Presentation:cephalic ;LOA  ).  APGAR:7 ,9 ; weight  not yet done.   Placenta status:delivered ,spontaneously and intact .  Cord: 3vv with the following complications: none  Anesthesia:  epidural Episiotomy:  none Lacerations:  2nd degree Suture Repair: 3.0 vicryl Est. Blood Loss (mL):   Mom to postpartum.  Baby to Couplet care / Skin to Skin.  Vick Frees 11/18/2019, 11:20 PM

## 2019-06-24 ENCOUNTER — Other Ambulatory Visit: Payer: PPO

## 2019-09-24 LAB — OB RESULTS CONSOLE RPR: RPR: NONREACTIVE

## 2019-11-03 LAB — OB RESULTS CONSOLE GBS: GBS: NEGATIVE

## 2019-11-18 ENCOUNTER — Other Ambulatory Visit: Payer: Self-pay

## 2019-11-18 ENCOUNTER — Encounter (HOSPITAL_COMMUNITY): Payer: Self-pay | Admitting: Obstetrics and Gynecology

## 2019-11-18 ENCOUNTER — Inpatient Hospital Stay (HOSPITAL_COMMUNITY): Payer: PPO | Admitting: Anesthesiology

## 2019-11-18 ENCOUNTER — Inpatient Hospital Stay (HOSPITAL_COMMUNITY)
Admission: AD | Admit: 2019-11-18 | Discharge: 2019-11-20 | DRG: 806 | Disposition: A | Discharge status: Home / Self Care | Payer: PPO | Attending: Obstetrics and Gynecology | Admitting: Obstetrics and Gynecology

## 2019-11-18 DIAGNOSIS — D62 Acute posthemorrhagic anemia: Secondary | ICD-10-CM | POA: Diagnosis not present

## 2019-11-18 DIAGNOSIS — Z20822 Contact with and (suspected) exposure to covid-19: Secondary | ICD-10-CM | POA: Diagnosis present

## 2019-11-18 DIAGNOSIS — O26893 Other specified pregnancy related conditions, third trimester: Secondary | ICD-10-CM | POA: Diagnosis present

## 2019-11-18 DIAGNOSIS — O9081 Anemia of the puerperium: Secondary | ICD-10-CM | POA: Diagnosis not present

## 2019-11-18 DIAGNOSIS — Z3A38 38 weeks gestation of pregnancy: Secondary | ICD-10-CM | POA: Diagnosis not present

## 2019-11-18 DIAGNOSIS — O9902 Anemia complicating childbirth: Secondary | ICD-10-CM

## 2019-11-18 HISTORY — DX: Other specified health status: Z78.9

## 2019-11-18 LAB — CBC
HCT: 36.9 % (ref 36.0–46.0)
Hemoglobin: 11.6 g/dL — ABNORMAL LOW (ref 12.0–15.0)
MCH: 26.9 pg (ref 26.0–34.0)
MCHC: 31.4 g/dL (ref 30.0–36.0)
MCV: 85.4 fL (ref 80.0–100.0)
Platelets: 238 10*3/uL (ref 150–400)
RBC: 4.32 MIL/uL (ref 3.87–5.11)
RDW: 16.3 % — ABNORMAL HIGH (ref 11.5–15.5)
WBC: 6.8 10*3/uL (ref 4.0–10.5)
nRBC: 0 % (ref 0.0–0.2)

## 2019-11-18 LAB — TYPE AND SCREEN
ABO/RH(D): A POS
Antibody Screen: NEGATIVE

## 2019-11-18 LAB — ABO/RH: ABO/RH(D): A POS

## 2019-11-18 LAB — SARS CORONAVIRUS 2 BY RT PCR (HOSPITAL ORDER, PERFORMED IN ~~LOC~~ HOSPITAL LAB): SARS Coronavirus 2: NEGATIVE

## 2019-11-18 MED ORDER — LACTATED RINGERS IV SOLN
500.0000 mL | Freq: Once | INTRAVENOUS | Status: AC
  Administered 2019-11-18: 500 mL via INTRAVENOUS

## 2019-11-18 MED ORDER — PHENYLEPHRINE 40 MCG/ML (10ML) SYRINGE FOR IV PUSH (FOR BLOOD PRESSURE SUPPORT)
PREFILLED_SYRINGE | INTRAVENOUS | Status: AC
  Filled 2019-11-18: qty 10

## 2019-11-18 MED ORDER — PHENYLEPHRINE 40 MCG/ML (10ML) SYRINGE FOR IV PUSH (FOR BLOOD PRESSURE SUPPORT): 80.0000 ug | PREFILLED_SYRINGE | INTRAVENOUS | Status: DC | PRN

## 2019-11-18 MED ORDER — SOD CITRATE-CITRIC ACID 500-334 MG/5ML PO SOLN: 30.0000 mL | ORAL | Status: DC | PRN

## 2019-11-18 MED ORDER — OXYTOCIN-SODIUM CHLORIDE 30-0.9 UT/500ML-% IV SOLN
2.5000 [IU]/h | INTRAVENOUS | Status: DC
  Filled 2019-11-18: qty 500

## 2019-11-18 MED ORDER — EPHEDRINE 5 MG/ML INJ: 10.0000 mg | INTRAVENOUS | Status: DC | PRN

## 2019-11-18 MED ORDER — OXYCODONE-ACETAMINOPHEN 5-325 MG PO TABS: 1.0000 | ORAL_TABLET | ORAL | Status: DC | PRN

## 2019-11-18 MED ORDER — FENTANYL-BUPIVACAINE-NACL 0.5-0.125-0.9 MG/250ML-% EP SOLN: 12.0000 mL/h | EPIDURAL | Status: DC | PRN

## 2019-11-18 MED ORDER — FENTANYL-BUPIVACAINE-NACL 0.5-0.125-0.9 MG/250ML-% EP SOLN
EPIDURAL | Status: AC
  Filled 2019-11-18: qty 250

## 2019-11-18 MED ORDER — LACTATED RINGERS IV SOLN: 500.0000 mL | INTRAVENOUS | Status: DC | PRN

## 2019-11-18 MED ORDER — LIDOCAINE HCL (PF) 1 % IJ SOLN: 30.0000 mL | INTRAMUSCULAR | Status: DC | PRN

## 2019-11-18 MED ORDER — LACTATED RINGERS IV SOLN: INTRAVENOUS | Status: DC

## 2019-11-18 MED ORDER — OXYTOCIN BOLUS FROM INFUSION
500.0000 mL | Freq: Once | INTRAVENOUS | Status: AC
  Administered 2019-11-18: 500 mL via INTRAVENOUS

## 2019-11-18 MED ORDER — ONDANSETRON HCL 4 MG/2ML IJ SOLN: 4.0000 mg | Freq: Four times a day (QID) | INTRAMUSCULAR | Status: DC | PRN

## 2019-11-18 MED ORDER — FLEET ENEMA 7-19 GM/118ML RE ENEM: 1.0000 | ENEMA | RECTAL | Status: DC | PRN

## 2019-11-18 MED ORDER — ACETAMINOPHEN 325 MG PO TABS: 650.0000 mg | ORAL_TABLET | ORAL | Status: DC | PRN

## 2019-11-18 MED ORDER — SODIUM CHLORIDE (PF) 0.9 % IJ SOLN
INTRAMUSCULAR | Status: DC | PRN
  Administered 2019-11-18: 12 mL/h via EPIDURAL

## 2019-11-18 MED ORDER — DIPHENHYDRAMINE HCL 50 MG/ML IJ SOLN: 12.5000 mg | INTRAMUSCULAR | Status: DC | PRN

## 2019-11-18 MED ORDER — OXYCODONE-ACETAMINOPHEN 5-325 MG PO TABS: 2.0000 | ORAL_TABLET | ORAL | Status: DC | PRN

## 2019-11-18 MED ORDER — LIDOCAINE HCL (PF) 1 % IJ SOLN
INTRAMUSCULAR | Status: DC | PRN
  Administered 2019-11-18: 1 mL via EPIDURAL
  Administered 2019-11-18: 8 mL via EPIDURAL

## 2019-11-18 NOTE — Plan of Care (Signed)

## 2019-11-18 NOTE — H&P (Signed)
Alice [name] is a 24 y.o. G2P1001 at [redacted]w[redacted]d gestation presents for complaint of Contractions and Loss of fluid.  +FM; yellow fluid with srom, 6:15 pm; ctx started around 2pm then strong after srom  Antepartum course: uncomplicated; efw 2-2 wks ago, 48% 6'7" [facility] at [facility] since 10 wks.  Uncomplicated  See complete pre-natal records  History OB History    Gravida  2   Para  1   Term  1   Preterm      AB      Living  1     SAB      TAB      Ectopic      Multiple  0   Live Births  1          Past Medical History:  Diagnosis Date  . Medical history non-contributory    Past Surgical History:  Procedure Laterality Date  . WISDOM TOOTH EXTRACTION     Family History: family history includes Diabetes in her maternal grandmother. Social History:  reports that she has never smoked. She has never used smokeless tobacco. She reports that she does not drink alcohol or use drugs.  ROS: See above otherwise negative  Prenatal labs:  ABO, Rh: --/--/A POS, A POS Performed at [facility], [address] [place], [place] [address]  [phone] 2002) Antibody: NEG (06/09 2002) Rubella:  immune RPR:   neg HBsAg:   neg HIV:  neg GBS:   neg 1 hr Glucola: Normal Genetic screening: Normal Anatomy US: Normal  Physical Exam:   Dilation: 4.5 Effacement (%): 80 Station: -2 Exam by:: Latricia Heft, RN Blood pressure 119/75, pulse 82, temperature 98.7 F (37.1 C), temperature source Oral, resp. rate 14, SpO2 99 %, unknown if currently breastfeeding. A&O x 3 HEENT: Normal Lungs: CTAB CV: RRR Abdominal: Soft, Non-tender and Gravid; efw 7lbs Lower Extremities: Non-edematous, Non-tender  Pelvic Exam:      Dilatation: 8cm     Effacement: 90%     Station: -1     Presentation: Cephalic  Labs:  CBC:  Lab Results  Component Value Date   WBC 6.8 11/18/2019   RBC 4.32 11/18/2019   HGB 11.6 (L) 11/18/2019   HCT 36.9 11/18/2019   MCV 85.4 11/18/2019   MCH  26.9 11/18/2019   MCHC 31.4 11/18/2019   RDW 16.3 (H) 11/18/2019   PLT 238 11/18/2019   CMP: No results found for: NA, K, CL, CO2, GLUCOSE, BUN, CREATININE, CALCIUM, PROT, AST, ALT, ALBUMIN, ALKPHOS, BILITOT, GFRNONAA, GFRAA, ANIONGAP Urine: No results found for: COLORURINE, APPEARANCEUR, LABSPEC, PHURINE, GLUCOSEU, HGBUR, BILIRUBINUR, KETONESUR, PROTEINUR, NITRITE, LEUKOCYTESUR   Prenatal Transfer Tool  Maternal Diabetes: No Genetic Screening: Normal Maternal Ultrasounds/Referrals: Normal Fetal Ultrasounds or other Referrals:  None Maternal Substance Abuse:  No Significant Maternal Medications:  None Significant Maternal Lab Results: Group B Strep negative  FHT: 150s, nml variability, +accels, no decels TOCO: q 1-2 min  Assessment/Plan:  24 y.o. G2P1001 at [redacted]w[redacted]d gestation   1. Active labor, srom - plan svd 2. Fetal status reassuring 3. gbs neg   Vick Frees 11/18/2019, 9:22 PM

## 2019-11-18 NOTE — Anesthesia Procedure Notes (Signed)
Epidural Patient location during procedure: OB Start time: 11/18/2019 8:50 PM End time: 11/18/2019 8:58 PM  Staffing Anesthesiologist: Lannie Fields, DO Performed: anesthesiologist   Preanesthetic Checklist Completed: patient identified, IV checked, risks and benefits discussed, monitors and equipment checked, pre-op evaluation and timeout performed  Epidural Patient position: sitting Prep: DuraPrep and site prepped and draped Patient monitoring: continuous pulse ox, blood pressure, heart rate and cardiac monitor Approach: midline Location: L3-L4 Injection technique: LOR air  Needle:  Needle type: Tuohy  Needle gauge: 17 G Needle length: 9 cm Needle insertion depth: 5 cm Catheter type: closed end flexible Catheter size: 19 Gauge Catheter at skin depth: 10 cm Test dose: negative  Assessment Sensory level: T8 Events: blood not aspirated, injection not painful, no injection resistance, no paresthesia and negative IV test  Additional Notes Patient identified. Risks/Benefits/Options discussed with patient including but not limited to bleeding, infection, nerve damage, paralysis, failed block, incomplete pain control, headache, blood pressure changes, nausea, vomiting, reactions to medication both or allergic, itching and postpartum back pain. Confirmed with bedside nurse the patient's most recent platelet count. Confirmed with patient that they are not currently taking any anticoagulation, have any bleeding history or any family history of bleeding disorders. Patient expressed understanding and wished to proceed. All questions were answered. Sterile technique was used throughout the entire procedure. Please see nursing notes for vital signs. Test dose was given through epidural catheter and negative prior to continuing to dose epidural or start infusion. Warning signs of high block given to the patient including shortness of breath, tingling/numbness in hands, complete motor block,  or any concerning symptoms with instructions to call for help. Patient was given instructions on fall risk and not to get out of bed. All questions and concerns addressed with instructions to call with any issues or inadequate analgesia.  Reason for block:procedure for pain

## 2019-11-18 NOTE — Anesthesia Preprocedure Evaluation (Signed)
Anesthesia Evaluation  Patient identified by MRN, date of birth, ID band Patient awake    Reviewed: Allergy & Precautions, H&P , Patient's Chart, lab work & pertinent test results  Airway Mallampati: I  TM Distance: >3 FB Neck ROM: full    Dental no notable dental hx. (+) Teeth Intact   Pulmonary neg pulmonary ROS,    Pulmonary exam normal breath sounds clear to auscultation       Cardiovascular negative cardio ROS Normal cardiovascular exam Rhythm:regular Rate:Normal     Neuro/Psych negative neurological ROS  negative psych ROS   GI/Hepatic negative GI ROS, Neg liver ROS,   Endo/Other  negative endocrine ROS  Renal/GU negative Renal ROS  negative genitourinary   Musculoskeletal negative musculoskeletal ROS (+)   Abdominal   Peds  Hematology negative hematology ROS (+) hct 36.9, plt 238   Anesthesia Other Findings   Reproductive/Obstetrics (+) Pregnancy                             Anesthesia Physical Anesthesia Plan  ASA: II and emergent  Anesthesia Plan: Epidural   Post-op Pain Management:    Induction:   PONV Risk Score and Plan: 2  Airway Management Planned: Natural Airway  Additional Equipment: None  Intra-op Plan:   Post-operative Plan:   Informed Consent: I have reviewed the patients History and Physical, chart, labs and discussed the procedure including the risks, benefits and alternatives for the proposed anesthesia with the patient or authorized representative who has indicated his/her understanding and acceptance.       Plan Discussed with:   Anesthesia Plan Comments:         Anesthesia Quick Evaluation

## 2019-11-18 NOTE — MAU Note (Addendum)
Unable to print sunquest labels. Reads "application state not providing CLM approp user. Covid swab obtained without difficulty and pt tol well. No symptoms

## 2019-11-18 NOTE — MAU Note (Signed)
Presents with c/o LOF, reports leaking began @ 1815 and is green.  Reports having ctxs, unsure of frequency.  Endorses +FM.

## 2019-11-19 ENCOUNTER — Encounter (HOSPITAL_COMMUNITY): Payer: Self-pay | Admitting: Obstetrics and Gynecology

## 2019-11-19 LAB — CBC
HCT: 31.6 % — ABNORMAL LOW (ref 36.0–46.0)
Hemoglobin: 10.1 g/dL — ABNORMAL LOW (ref 12.0–15.0)
MCH: 27.4 pg (ref 26.0–34.0)
MCHC: 32 g/dL (ref 30.0–36.0)
MCV: 85.9 fL (ref 80.0–100.0)
Platelets: 196 10*3/uL (ref 150–400)
RBC: 3.68 MIL/uL — ABNORMAL LOW (ref 3.87–5.11)
RDW: 16.3 % — ABNORMAL HIGH (ref 11.5–15.5)
WBC: 10.7 10*3/uL — ABNORMAL HIGH (ref 4.0–10.5)
nRBC: 0 % (ref 0.0–0.2)

## 2019-11-19 LAB — RPR: RPR Ser Ql: NONREACTIVE

## 2019-11-19 MED ORDER — TETANUS-DIPHTH-ACELL PERTUSSIS 5-2.5-18.5 LF-MCG/0.5 IM SUSP: 0.5000 mL | Freq: Once | INTRAMUSCULAR | Status: DC

## 2019-11-19 MED ORDER — ONDANSETRON HCL 4 MG PO TABS: 4.0000 mg | ORAL_TABLET | ORAL | Status: DC | PRN

## 2019-11-19 MED ORDER — SENNOSIDES-DOCUSATE SODIUM 8.6-50 MG PO TABS
2.0000 | ORAL_TABLET | ORAL | Status: DC
  Administered 2019-11-20: 2 via ORAL
  Filled 2019-11-19: qty 2

## 2019-11-19 MED ORDER — WITCH HAZEL-GLYCERIN EX PADS: 1.0000 "application " | MEDICATED_PAD | CUTANEOUS | Status: DC | PRN

## 2019-11-19 MED ORDER — POLYSACCHARIDE IRON COMPLEX 150 MG PO CAPS
150.0000 mg | ORAL_CAPSULE | Freq: Every day | ORAL | Status: DC
  Administered 2019-11-19 – 2019-11-20 (×2): 150 mg via ORAL
  Filled 2019-11-19 (×2): qty 1

## 2019-11-19 MED ORDER — IBUPROFEN 800 MG PO TABS
800.0000 mg | ORAL_TABLET | Freq: Four times a day (QID) | ORAL | Status: DC
  Administered 2019-11-19 – 2019-11-20 (×4): 800 mg via ORAL
  Filled 2019-11-19 (×4): qty 1

## 2019-11-19 MED ORDER — DIBUCAINE (PERIANAL) 1 % EX OINT: 1.0000 "application " | TOPICAL_OINTMENT | CUTANEOUS | Status: DC | PRN

## 2019-11-19 MED ORDER — ACETAMINOPHEN 500 MG PO TABS
1000.0000 mg | ORAL_TABLET | Freq: Four times a day (QID) | ORAL | Status: DC | PRN
  Administered 2019-11-19 – 2019-11-20 (×3): 1000 mg via ORAL
  Filled 2019-11-19 (×3): qty 2

## 2019-11-19 MED ORDER — ACETAMINOPHEN 325 MG PO TABS
650.0000 mg | ORAL_TABLET | ORAL | Status: DC | PRN
  Administered 2019-11-19: 650 mg via ORAL
  Filled 2019-11-19: qty 2

## 2019-11-19 MED ORDER — BENZOCAINE-MENTHOL 20-0.5 % EX AERO
INHALATION_SPRAY | CUTANEOUS | Status: AC
  Administered 2019-11-20: 1 via TOPICAL
  Filled 2019-11-19: qty 56

## 2019-11-19 MED ORDER — SIMETHICONE 80 MG PO CHEW: 80.0000 mg | CHEWABLE_TABLET | ORAL | Status: DC | PRN

## 2019-11-19 MED ORDER — COCONUT OIL OIL: 1.0000 "application " | TOPICAL_OIL | Status: DC | PRN

## 2019-11-19 MED ORDER — BENZOCAINE-MENTHOL 20-0.5 % EX AERO
1.0000 "application " | INHALATION_SPRAY | CUTANEOUS | Status: DC | PRN
  Administered 2019-11-19: 1 via TOPICAL
  Filled 2019-11-19: qty 56

## 2019-11-19 MED ORDER — IBUPROFEN 600 MG PO TABS
600.0000 mg | ORAL_TABLET | Freq: Four times a day (QID) | ORAL | Status: DC
  Administered 2019-11-19 (×2): 600 mg via ORAL
  Filled 2019-11-19 (×2): qty 1

## 2019-11-19 MED ORDER — DIPHENHYDRAMINE HCL 25 MG PO CAPS: 25.0000 mg | ORAL_CAPSULE | Freq: Four times a day (QID) | ORAL | Status: DC | PRN

## 2019-11-19 MED ORDER — PRENATAL MULTIVITAMIN CH
1.0000 | ORAL_TABLET | Freq: Every day | ORAL | Status: DC
  Administered 2019-11-19 – 2019-11-20 (×2): 1 via ORAL
  Filled 2019-11-19 (×2): qty 1

## 2019-11-19 MED ORDER — ONDANSETRON HCL 4 MG/2ML IJ SOLN: 4.0000 mg | INTRAMUSCULAR | Status: DC | PRN

## 2019-11-19 MED ORDER — MAGNESIUM OXIDE 400 (241.3 MG) MG PO TABS
400.0000 mg | ORAL_TABLET | Freq: Every day | ORAL | Status: DC
  Administered 2019-11-19 – 2019-11-20 (×2): 400 mg via ORAL
  Filled 2019-11-19 (×2): qty 1

## 2019-11-19 NOTE — Lactation Note (Signed)
This note was copied from a baby's chart. Lactation Consultation Note  Patient Name: Alice [name] [id number] Date: 11/19/2019 Reason for consult: Initial assessment   Mother is a P2, infant is 78 hours old. Mother reports that she breast fed her first child for 14 months.   Mother was given [facility] brochure and basic teaching done.   Mother reports that infant is not feeding well. Husband is bottle feeding infant  Colostrum . Mother reports that infant is refusing the breast.   Infant latched well for 15-20 mins. Observed frequent suckles and audible swallows.  Mother very relieved.  Parents had lots of questions and concerns. They reports that they had a difficult start with the first child.   Plan of Care : Breastfeed infant with feeding cues.   Mother to continue to cue base feed infant and feed at least 8-12 times or more in 24 hours and advised to allow for cluster feeding infant as needed.   Mother to continue to due STS. Mother is aware of available LC services at [facility], BFSG'S, OP Dept, and phone # for questions or concerns about breastfeeding.  Mother receptive to all teaching and plan of care.     Maternal Data    Feeding Feeding Type: Breast Fed  LATCH Score Latch: Grasps breast easily, tongue down, lips flanged, rhythmical sucking.  Audible Swallowing: Spontaneous and intermittent  Type of Nipple: Everted at rest and after stimulation  Comfort (Breast/Nipple): Soft / non-tender  Hold (Positioning): Assistance needed to correctly position infant at breast and maintain latch.  LATCH Score: 9  Interventions Interventions: Breast feeding basics reviewed;Assisted with latch;Skin to skin;Hand express;Breast compression;Adjust position;Support pillows;Position options  Lactation Tools Discussed/Used     Consult Status Consult Status: Follow-up Date: 11/20/19 Follow-up type: In-patient    Stevan [facility] 11/19/2019, 4:18 PM

## 2019-11-19 NOTE — Progress Notes (Signed)
PPD #1, SVD @ 2239, 2nd degree repair, baby boy  S:  Reports feeling tired and sore from repair              Tolerating po/ No nausea or vomiting / Denies dizziness or SOB             Bleeding is moderate             Pain not well controlled              Up ad lib / ambulatory / voiding QS without difficulty   Newborn breast feeding - baby spitting up now, not feeding well  / Circumcision - planning  O:               VS: BP 101/72 (BP Location: Right Arm)   Pulse 71   Temp 98.1 F (36.7 C) (Oral)   Resp 18   Ht 5\' 2"  (1.575 m)   Wt 62.1 kg   SpO2 99%   Breastfeeding Unknown   BMI 25.06 kg/m  Patient Vitals for the past 24 hrs:  BP Temp Temp src Pulse Resp SpO2 Height Weight  11/19/19 1001 101/72 98.1 F (36.7 C) Oral 71 18 -- -- --  11/19/19 0616 (!) 104/56 99.1 F (37.3 C) Oral 83 18 99 % -- --  11/19/19 0212 109/62 98 F (36.7 C) Oral 100 18 99 % -- --  11/19/19 0108 111/69 99.2 F (37.3 C) Oral 72 18 100 % -- --  11/19/19 0030 108/68 -- -- 77 16 -- -- --  11/19/19 0015 109/62 -- -- 78 14 -- -- --  11/19/19 0000 106/67 -- -- 78 16 -- -- --  11/18/19 2345 113/67 98.6 F (37 C) Oral 71 14 -- -- --  11/18/19 2330 115/74 -- -- 79 16 -- -- --  11/18/19 2316 105/61 -- -- 79 15 -- -- --  11/18/19 2301 116/66 -- -- 80 13 -- -- --  11/18/19 2254 111/64 -- -- 83 13 -- -- --  11/18/19 2230 (!) 107/91 -- -- (!) 154 -- -- -- --  11/18/19 2201 104/84 98.8 F (37.1 C) Oral 86 16 -- -- --  11/18/19 2147 -- -- -- -- -- 100 % -- --  11/18/19 2140 -- -- -- -- -- 100 % -- --  11/18/19 2136 118/69 -- -- 83 -- -- -- --  11/18/19 2135 -- -- -- -- -- 100 % -- --  11/18/19 2131 113/77 -- -- 81 -- -- -- --  11/18/19 2130 -- -- -- -- -- 99 % -- --  11/18/19 2126 127/75 -- -- 83 -- 99 % -- --  11/18/19 2124 -- -- -- -- -- -- 5\' 2"  (1.575 m) 62.1 kg  11/18/19 2121 120/70 -- -- 82 -- -- -- --  11/18/19 2120 -- -- -- -- -- 99 % -- --  11/18/19 2116 119/75 -- -- 82 -- -- -- --  11/18/19 2115  -- -- -- -- -- 100 % -- --  11/18/19 2111 121/65 -- -- 88 14 -- -- --  11/18/19 2110 -- -- -- -- -- 99 % -- --  11/18/19 2106 114/66 -- -- 76 -- -- -- --  11/18/19 2105 -- -- -- -- -- 99 % -- --  11/18/19 2103 128/70 -- -- 70 16 -- -- --  11/18/19 2102 (!) 121/54 -- -- 71 -- -- -- --  11/18/19 2100 -- -- -- -- -- 99 % -- --  11/18/19 2057 (!) 114/57 -- -- 86 15 -- -- --  11/18/19 2055 109/65 -- -- 95 14 100 % -- --  11/18/19 2045 -- -- -- -- -- 100 % -- --  11/18/19 2021 102/80 98.7 F (37.1 C) Oral 81 15 -- -- --  11/18/19 1916 124/65 98.7 F (37.1 C) Oral 86 20 98 % -- --    LABS:              Recent Labs    11/18/19 2002 11/19/19 0647  WBC 6.8 10.7*  HGB 11.6* 10.1*  PLT 238 196               Blood type: --/--/A POS, A POS Performed at [place] [facility], [place] [address] [place], [place] [address]  [phone] 2002)  Rubella: Immune (11/17 0000)                     I&O: Intake/Output      06/09 0701 - 06/10 0700 06/10 0701 - 06/11 0700   I.V. (mL/kg) 0 (0)    Other 0    Total Intake(mL/kg) 0 (0)    Urine (mL/kg/hr) 300    Blood 169    Total Output 469    Net -469         Urine Occurrence 1 x                  Physical Exam:             Alert and oriented X3  Lungs: Clear and unlabored  Heart: regular rate and rhythm / no murmurs  Abdomen: soft, non-tender, non-distended              Fundus: firm, non-tender, U-1  Perineum: well approximated 2nd degree, (+) ecchymosis, (+) edema  Lochia: moderate lochia on ice pack pad  Extremities: no edema, no calf pain or tenderness    A/P: PPD # 1, SVD  2nd degree perineal repair   ABL Anemia compounding chronic IDA   - Begin Niferex 150mg  PO daily   - Magnesium oxide 400mg  PO daily    Doing well - stable status  Routine post partum orders  Circ prior to d/c  Increase Motrin to 900mg  every 6hrs  Tylenol 1000mg  every 6hrs PRN  Anticipate d/c home tomorrow  Lars Pinks, MSN, CNM [facility]

## 2019-11-19 NOTE — Anesthesia Postprocedure Evaluation (Signed)
Anesthesia Post Note  Patient: Alice [name]  Procedure(s) Performed: AN AD HOC LABOR EPIDURAL     Patient location during evaluation: Mother Baby Anesthesia Type: Epidural Level of consciousness: awake Pain management: satisfactory to patient Vital Signs Assessment: post-procedure vital signs reviewed and stable Respiratory status: spontaneous breathing Cardiovascular status: stable Anesthetic complications: no   No complications documented.  Last Vitals:  Vitals:   11/19/19 0212 11/19/19 0616  BP: 109/62 (!) 104/56  Pulse: 100 83  Resp: 18 18  Temp: 36.7 C 37.3 C  SpO2: 99% 99%    Last Pain:  Vitals:   11/19/19 0616  TempSrc: Oral  PainSc: 2    Pain Goal:                   Cephus Shelling

## 2019-11-20 MED ORDER — IBUPROFEN 800 MG PO TABS: 800.0000 mg | ORAL_TABLET | Freq: Four times a day (QID) | ORAL | 6 refills | Status: DC | PRN

## 2019-11-20 NOTE — Progress Notes (Signed)
PPD2 SVD:   S:  Pt reports feeling  well/ Tolerating po/ Voiding without problems/ No n/v/ Bleeding is light/ Pain controlled withprescription NSAID's including ibuprofen (Motrin)  Newborn info live female BRF   O:  A & O x 3 / VS: Blood pressure (!) 92/55, pulse 69, temperature 98.5 F (36.9 C), temperature source Oral, resp. rate 18, height 5\' 2"  (1.575 m), weight 62.1 kg, SpO2 100 %, unknown if currently breastfeeding.  LABS: No results found for this or any previous visit (from the past 24 hour(s)).  I&O: I/O last 3 completed shifts: In: 0  Out: 469 [Urine:300; Blood:169]   No intake/output data recorded.  Lungs: chest clear, no wheezing, rales, normal symmetric air entry  Heart: regular rate and rhythm, S1, S2 normal, no murmur, click, rub or gallop  Abdomen: soft Uterus at umb   Perineum: healing with good reapproximation  Lochia:light  Extremities:no redness or tenderness in the calves or thighs, no edema    A/P: PPD # 2/  Doing well  Continue routine post partum orders  D/c instructions reviewed F/u 6 wk WOB pp booklet given

## 2019-11-20 NOTE — Discharge Instructions (Signed)
Per postpartum booklet given 

## 2019-11-20 NOTE — Lactation Note (Signed)
This note was copied from a baby's chart. Lactation Consultation Note  Patient Name: Alice [name] [id number]'P Date: 11/20/2019 Reason for consult: Follow-up assessment  Infant in crib on arrival.  Post circ. Baby Alice Girten now 66 hours old and being d/c. Parents report how they are supposed to feed him when he falls asleep. Discussed STS, stimulation to keep him awake.  Assist with breastfeeding. Assist with hand expressing prior to latching.  Mom reports nipples ore.  Large drops of colostrum seen. Infant latched and breastfed well but needed continued stimulation to eat.  Audible swallows heard.  Showed mom how to massage down and compress breast to keep him going.  Mom asked how to get more milk.  Urged to breastfeed frequently, hand express, massage and pump with DEBP everytime he gets a bottle.  Mom asked about how often to feed.  Urged her to feed on cue and at least 8-12 or more times a day if he does not cue. Left mom and baby breastfeeding.  Urged to call lactation as needed.  Maternal Data Has patient been taught Hand Expression?: Yes  Feeding Feeding Type: Breast Fed  LATCH Score Latch: Grasps breast easily, tongue down, lips flanged, rhythmical sucking.  Audible Swallowing: Spontaneous and intermittent  Type of Nipple: Everted at rest and after stimulation  Comfort (Breast/Nipple): Filling, red/small blisters or bruises, mild/mod discomfort  Hold (Positioning): Assistance needed to correctly position infant at breast and maintain latch.  LATCH Score: 8  Interventions Interventions: Breast feeding basics reviewed;Assisted with latch;Skin to skin;Hand express;Reverse pressure;Breast compression;Support pillows;Expressed milk  Lactation Tools Discussed/Used     Consult Status Consult Status: Complete Date: 11/20/19 Follow-up type: Call as needed    [facility] 11/20/2019, 11:44 AM

## 2019-11-22 NOTE — Discharge Summary (Signed)
Postpartum Discharge Summary       Patient Name: Alice [name] DOB: [birth year] MRN: [id number]  Date of admission: 11/18/2019 Delivery date:11/18/2019  Delivering provider: Tiana Loft E  Date of discharge: 11/20/2019  Admitting diagnosis: Indication for care in labor or delivery [O75.9] Intrauterine pregnancy: [redacted]w[redacted]d    Secondary diagnosis:  Principal Problem:   PP care - NSVB 6/9 Active Problems:   SVD (spontaneous vaginal delivery)   Second-degree perineal laceration, with delivery   Indication for care in labor or delivery  Additional problems: none    Discharge diagnosis: Term Pregnancy Delivered                                              Post partum procedures:none Augmentation: N/A Complications: None  Hospital course: Onset of Labor With Vaginal Delivery      24y.o. yo [id number] admitted in Active Labor on 11/18/2019. Patient had an uncomplicated labor course as follows:  Membrane Rupture Time/Date: 6:15 PM ,11/18/2019   Delivery Method:Vaginal, Spontaneous  Episiotomy: None  Lacerations:  2nd degree  Patient had an uncomplicated postpartum course.  She is ambulating, tolerating a regular diet, passing flatus, and urinating well. Patient is discharged home in stable condition on 11/20/2019.  Newborn Data: Birth date:11/18/2019  Birth time:10:39 PM  Gender:Female  Living status:Living  Apgars:8 ,9  Weight:3410 g   Magnesium Sulfate received: No BMZ received: No Rhophylac:N/A MMR:N/A T-DaP:Given prenatally Flu: No Transfusion:No  Physical exam  Vitals:   11/19/19 1001 11/19/19 1404 11/19/19 2047 11/20/19 0534  BP: 101/72 106/66 99/66 (!) 92/55  Pulse: 71 84 86 69  Resp: 18 16 16 18   Temp: 98.1 F (36.7 C) 98.1 F (36.7 C) 98.5 F (36.9 C)   TempSrc: Oral Oral Oral   SpO2:   100% 100%  Weight:      Height:       General: alert, cooperative and no distress Lochia: appropriate Uterine Fundus: firm Incision: N/A DVT Evaluation: No  evidence of DVT seen on physical exam. Labs: Lab Results  Component Value Date   WBC 10.7 (H) 11/19/2019   HGB 10.1 (L) 11/19/2019   HCT 31.6 (L) 11/19/2019   MCV 85.9 11/19/2019   PLT 196 11/19/2019   No flowsheet data found. Edinburgh Score: Edinburgh Postnatal Depression Scale Screening Tool 11/15/2017  I have been able to laugh and see the funny side of things. 0  I have looked forward with enjoyment to things. 0  I have blamed myself unnecessarily when things went wrong. 1  I have been anxious or worried for no good reason. 1  I have felt scared or panicky for no good reason. 0  Things have been getting on top of me. 1  I have been so unhappy that I have had difficulty sleeping. 1  I have felt sad or miserable. 0  I have been so unhappy that I have been crying. 0  The thought of harming myself has occurred to me. 0  Edinburgh Postnatal Depression Scale Total 4      After visit meds:  Allergies as of 11/20/2019   No Known Allergies     Medication List    TAKE these medications   acetaminophen 500 MG tablet Commonly known as: TYLENOL Take 500 mg by mouth every 6 (six) hours as needed for mild pain  or headache.   coconut oil Oil Apply 1 application topically as needed.   dibucaine 1 % Oint Commonly known as: NUPERCAINAL Place 1 application rectally as needed for hemorrhoids.   Fusion Plus Caps Take 1 tablet by mouth daily.   ibuprofen 800 MG tablet Commonly known as: ADVIL Take 1 tablet (800 mg total) by mouth every 6 (six) hours as needed. What changed:   medication strength  how much to take  when to take this  reasons to take this   Prenate Mini 18-0.6-0.4-350 MG Caps Take 1 tablet by mouth daily.   witch hazel-glycerin pad Commonly known as: TUCKS Apply 1 application topically as needed for hemorrhoids.            Discharge Care Instructions  (From admission, onward)         Start     Ordered   11/20/19 0000  Discharge wound care:        Comments: Sitz baths 2 times /day with warm water x 1 week. May add herbals: 1 ounce dried comfrey leaf* 1 ounce calendula flowers 1 ounce lavender flowers 1/2 ounce dried uva ursi leaves 1/2 ounce witch hazel blossoms (if you can find them) 1/2 ounce dried sage leaf 1/2 cup sea salt Directions: Bring 2 quarts of water to a boil. Turn off heat, and place 1 ounce (approximately 1 large handful) of the above mixed herbs (not the salt) into the pot. Steep, covered, for 30 minutes.  Strain the liquid well with a fine mesh strainer, and discard the herb material. Add 2 quarts of liquid to the tub, along with the 1/2 cup of salt. This medicinal liquid can also be made into compresses and peri-rinses.   11/20/19 1010           Discharge home in stable condition Infant Feeding: Breast Infant Disposition:home with mother Discharge instruction: per After Visit Summary and Postpartum booklet. Activity: Advance as tolerated. Pelvic rest for 6 weeks.  Diet: routine diet Anticipated Birth Control: Condoms Postpartum Appointment:6 weeks Additional Postpartum F/U: none Future Appointments:No future appointments. Follow up Visit:  Follow-up Information    Azucena Fallen, MD Follow up in 6 week(s).   Specialty: Obstetrics and Gynecology Contact information: [address] [place] [address] [phone]                   11/22/2019 Marvene Staff, MD

## 2020-03-01 ENCOUNTER — Other Ambulatory Visit: Payer: PPO

## 2020-03-01 DIAGNOSIS — Z20822 Contact with and (suspected) exposure to covid-19: Secondary | ICD-10-CM

## 2020-03-03 LAB — NOVEL CORONAVIRUS, NAA: SARS-CoV-2, NAA: NOT DETECTED

## 2020-03-03 LAB — SARS-COV-2, NAA 2 DAY TAT

## 2020-08-03 ENCOUNTER — Telehealth: Payer: Self-pay

## 2020-08-03 NOTE — Telephone Encounter (Signed)
NOTES ON FILE FROM VAISHALI MODY [phone] SENT REFERRAL TO  SCHEDULING

## 2020-08-11 ENCOUNTER — Ambulatory Visit: Payer: PPO | Admitting: Internal Medicine

## 2020-08-16 NOTE — Progress Notes (Signed)
 Cardiology Consult Note    Date:  08/17/2020   ID:  Alice [name], DOB 06/11/1996, MRN [id number]  PCP:  Patient, No Pcp Per  Cardiologist:  Traci Turner, MD   Chief Complaint  Patient presents with  . New Patient (Initial Visit)    Chest pain    History of Present Illness:  Alice [name] is a 25 y.o. female who is being seen today for the evaluation of chest pain at the request of Mody, Vaishali, MD.  This is a 25yo female with no prior cardiac hx who is referred for evaluation of chest pain. She says that a few weeks ago she started having chest pain that she describes as a burning sensation that is midsternal in location with no radiation.  It lasts for 5-10 minutes and then resolves.  There is no associated sx of nausea, diaphoresis or SOB.  She says it is not like what she gets with heartburn.  She has been under a lot of stress when this started.  She had COVID 19 in January and since then it is much more prominent but had felt something similar a year ago.  She says that initially it would come just with stress but now is comes even when she is not stressed.  She does not smoke but has been exposed to second had smoke.  Her paternal grandfather had some heart problems but she is not sure what they were.    Past Medical History:  Diagnosis Date  . Atrophic vaginitis   . Burning in the chest   . Chest wall pain   . Constipation   . Dyspareunia, female   . Dysuria   . Medical history non-contributory   . Postcoital bleeding   . Vaginal discharge   . Vaginal pain   . Yeast infection of nipple, postpartum     Past Surgical History:  Procedure Laterality Date  . DIAGNOSTIC LAPAROSCOPY WITH REMOVAL OF ECTOPIC PREGNANCY    . WISDOM TOOTH EXTRACTION      Current Medications: Current Meds  Medication Sig  . acetaminophen (TYLENOL) 500 MG tablet Take 500 mg by mouth every 6 (six) hours as needed for mild pain or headache.  . Cholecalciferol (VITAMIN D3) 10 MCG (400 UNIT)  tablet Take 1 tablet by mouth daily.  . coconut oil OIL Apply 1 application topically as needed.  . Prenat-FeCbn-FeAsp-Meth-FA-DHA (PRENATE MINI) 18-0.6-0.4-350 MG CAPS Take 1 tablet by mouth daily.    Allergies:   Patient has no known allergies.   Social History   Socioeconomic History  . Marital status: Married    Spouse name: Ayman  . Number of children: Not on file  . Years of education: Not on file  . Highest education level: Not on file  Occupational History  . Not on file  Tobacco Use  . Smoking status: Never Smoker  . Smokeless tobacco: Never Used  Vaping Use  . Vaping Use: Never used  Substance and Sexual Activity  . Alcohol use: Never  . Drug use: Never  . Sexual activity: Not Currently  Other Topics Concern  . Not on file  Social History Narrative  . Not on file   Social Determinants of Health   Financial Resource Strain: Not on file  Food Insecurity: Not on file  Transportation Needs: Not on file  Physical Activity: Not on file  Stress: Not on file  Social Connections: Not on file     Family History:  The patient's family history includes   Diabetes in her maternal grandmother and paternal grandmother.   ROS:   Please see the history of present illness.    ROS All other systems reviewed and are negative.  No flowsheet data found.     PHYSICAL EXAM:   VS:  BP 98/60   Pulse 81   Ht 5' 1.5" (1.562 m)   Wt 114 lb 6.4 oz (51.9 kg)   BMI 21.27 kg/m    GEN: Well nourished, well developed, in no acute distress  HEENT: normal  Neck: no JVD, carotid bruits, or masses Cardiac: RRR; no murmurs, rubs, or gallops,no edema.  Intact distal pulses bilaterally.  Respiratory:  clear to auscultation bilaterally, normal work of breathing GI: soft, nontender, nondistended, + BS MS: no deformity or atrophy  Skin: warm and dry, no rash Neuro:  Alert and Oriented x 3, Strength and sensation are intact Psych: euthymic mood, full affect  Wt Readings from Last 3  Encounters:  08/17/20 114 lb 6.4 oz (51.9 kg)  11/18/19 137 lb (62.1 kg)  11/13/17 139 lb 4 oz (63.2 kg)      Studies/Labs Reviewed:   EKG:  EKG is ordered today.  The ekg ordered today demonstrates NSR with no ST changes  Recent Labs: 11/19/2019: Hemoglobin 10.1; Platelets 196   Lipid Panel No results found for: CHOL, TRIG, HDL, CHOLHDL, VLDL, LDLCALC, LDLDIRECT   Additional studies/ records that were reviewed today include:  OV notes from PCP    ASSESSMENT:    1. Other chest pain      PLAN:  In order of problems listed above:  1. Chest pain -her chest pain is very atypical and sounds more GI related given that it is worse with stress and is burning sensation.  No associated sx or radiation -her EKG is nonischemic  -fm hx unclear but mom and dad have no medical problems except HTN in her father -She has never smoked but has been exposed to second hand smoke -She did have COVID in Jan so need to consider pericarditis -will get an ESR and hsCRP -2D echo to assess LVF and rule out pericardial effusion -check ETT to assess for ischemia that in her age group would be more from anomalous coronary artery -Shared Decision Making/Informed Consent The risks [chest pain, shortness of breath, cardiac arrhythmias, dizziness, blood pressure fluctuations, myocardial infarction, stroke/transient ischemic attack, and life-threatening complications (estimated to be 1 in 10,000)], benefits (risk stratification, diagnosing coronary artery disease, treatment guidance) and alternatives of an exercise tolerance test were discussed in detail with Ms. Norsworthy and she agrees to proceed. -if workup normal then recommend PPI and followup with GI   Medication Adjustments/Labs and Tests Ordered: Current medicines are reviewed at length with the patient today.  Concerns regarding medicines are outlined above.  Medication changes, Labs and Tests ordered today are listed in the Patient Instructions  below.  There are no Patient Instructions on file for this visit.   Signed, Traci Turner, MD  08/17/2020 9:20 AM    [facility] [address], [place], [place]  [address] Phone: [phone]; Fax: [phone]   

## 2020-08-17 ENCOUNTER — Encounter: Payer: Self-pay | Admitting: Cardiology

## 2020-08-17 ENCOUNTER — Ambulatory Visit (INDEPENDENT_AMBULATORY_CARE_PROVIDER_SITE_OTHER): Payer: PPO | Admitting: Cardiology

## 2020-08-17 ENCOUNTER — Other Ambulatory Visit: Payer: Self-pay

## 2020-08-17 VITALS — BP 98/60 | HR 81 | Ht 61.5 in | Wt 114.4 lb

## 2020-08-17 DIAGNOSIS — R0789 Other chest pain: Secondary | ICD-10-CM

## 2020-08-17 NOTE — Patient Instructions (Signed)
Medication Instructions:  Your physician recommends that you continue on your current medications as directed. Please refer to the Current Medication list given to you today.   *If you need a refill on your cardiac medications before your next appointment, please call your pharmacy*   Lab Work: TODAY: ESR, CRP If you have labs (blood work) drawn today and your tests are completely normal, you will receive your results only by: Marland Kitchen MyChart Message (if you have MyChart) OR . A paper copy in the mail If you have any lab test that is abnormal or we need to change your treatment, we will call you to review the results.   Testing/Procedures: Your physician has requested that you have an echocardiogram. Echocardiography is a painless test that uses sound waves to create images of your heart. It provides your doctor with information about the size and shape of your heart and how well your heart's chambers and valves are working. This procedure takes approximately one hour. There are no restrictions for this procedure.  Your physician has requested that you have an exercise tolerance test. For further information please visit [email or web address]. Please also follow instruction sheet, as given.   Follow-Up: At [facility], you and your health needs are our priority.  As part of our continuing mission to provide you with exceptional heart care, we have created designated Provider Care Teams.  These Care Teams include your primary Cardiologist (physician) and Advanced Practice Providers (APPs -  Physician Assistants and Nurse Practitioners) who all work together to provide you with the care you need, when you need it.  Follow up with Dr. Radford Pax as needed based on results of testing

## 2020-08-17 NOTE — Addendum Note (Signed)
Addended by: Theresia Majors on: 08/17/2020 10:38 AM   Modules accepted: Orders

## 2020-08-18 ENCOUNTER — Telehealth: Payer: Self-pay

## 2020-08-18 DIAGNOSIS — R0789 Other chest pain: Secondary | ICD-10-CM

## 2020-08-18 LAB — HIGH SENSITIVITY CRP: CRP, High Sensitivity: 3.85 mg/L — ABNORMAL HIGH (ref 0.00–3.00)

## 2020-08-18 LAB — SEDIMENTATION RATE: Sed Rate: 6 mm/hr (ref 0–32)

## 2020-08-18 NOTE — Telephone Encounter (Signed)
The patient has been notified of the result and verbalized understanding.  All questions (if any) were answered. Theresia Majors, RN 08/18/2020 3:32 PM  Echocardiogram has been cancelled.  Cardiac MRI has been ordered.

## 2020-08-18 NOTE — Telephone Encounter (Signed)
-----   Message from Quintella Reichert, MD sent at 08/18/2020 12:02 PM EST ----- hsCRP is elevated - changed echo to cardiac MRI with gad

## 2020-08-30 ENCOUNTER — Telehealth: Payer: Self-pay | Admitting: Cardiology

## 2020-08-30 ENCOUNTER — Encounter: Payer: Self-pay | Admitting: Cardiology

## 2020-08-30 NOTE — Telephone Encounter (Signed)
Spoke with patient regarding the Friday 09/30/20 8:00 am Cardiac MRI appointment at [facility] time is 7:45 am--1st floor admissions office for check in--will mail information to patient and it is available in My Chart.  Patient voiced her understanding.

## 2020-08-31 NOTE — Addendum Note (Signed)
Addended by: Riley Lam A on: 08/31/2020 02:52 PM   Modules accepted: Orders

## 2020-09-01 NOTE — Telephone Encounter (Signed)
Alice [name] reached out to express that this first available appointment on April 22nd is during their holy month when Alice [name] will be fasting and unable to have this test. They are requesting an appointment prior to April 1 or after April 30th.  I informed them that an appointment prior to April 1 is unlikely, however, we will look for one in case something has opened up. I let them know that we will place her on a wait list in case someone cancels on short notice. 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  At Alice [name]'s request, I am routing this update to Alice [name] nurse to ensure that this timeline (May 2022) is safe for the patient's current medical state. During our call today the patient's husband shared that the pt is experiencing "the pain" every other day.  I let them know that we would contact them tomorrow for rescheduling the April appt and any additional updates/information we received from Alice [name].

## 2020-09-02 ENCOUNTER — Telehealth: Payer: Self-pay | Admitting: Cardiology

## 2020-09-02 NOTE — Telephone Encounter (Signed)
Spoke with spouse concerning the Friday 09/09/20 8:00 am Cardiac MRI appointment at [facility] ---arrival time is 7:30 am--1st floor admissions office for check in.  Inquiry was made in reference to rescheduling the patient's GXT (at [facility]) to Friday 09/09/20 as well.  Informed spouse I would research and see if that was possible.  [facility] does not scheduled GXT's on Fridays--offered 3:30 pm GXT appointment here at [facility]. Information was relayed and spouse stated he would speak with his wife and call back.  Received return call stating Alice [name] will keep the 09/08/20 9:30 am appointment for the GXT at [facility].  Requested patient/spouse call back with any other questions or concerns.

## 2020-09-06 ENCOUNTER — Other Ambulatory Visit (HOSPITAL_COMMUNITY): Payer: PPO

## 2020-09-08 ENCOUNTER — Other Ambulatory Visit (HOSPITAL_COMMUNITY): Payer: PPO

## 2020-09-08 ENCOUNTER — Telehealth (HOSPITAL_COMMUNITY): Payer: Self-pay | Admitting: *Deleted

## 2020-09-08 NOTE — Telephone Encounter (Signed)
Attempted to call patient regarding upcoming cardiac MRI appointment. Left message with husband to have pt call back if she has any questions.  Larey Brick RN Navigator Cardiac Imaging [facility] [phone] Office [phone] Cell

## 2020-09-09 ENCOUNTER — Other Ambulatory Visit: Payer: Self-pay

## 2020-09-09 ENCOUNTER — Other Ambulatory Visit (HOSPITAL_COMMUNITY)
Admission: RE | Admit: 2020-09-09 | Discharge: 2020-09-09 | Disposition: A | Discharge status: Home / Self Care | Payer: PPO | Source: Ambulatory Visit | Attending: Orthopedic Surgery | Admitting: Orthopedic Surgery

## 2020-09-09 ENCOUNTER — Ambulatory Visit (HOSPITAL_COMMUNITY)
Admission: RE | Admit: 2020-09-09 | Discharge: 2020-09-09 | Disposition: A | Discharge status: Home / Self Care | Payer: PPO | Source: Ambulatory Visit | Attending: Cardiology | Admitting: Cardiology

## 2020-09-09 DIAGNOSIS — Z01812 Encounter for preprocedural laboratory examination: Secondary | ICD-10-CM | POA: Insufficient documentation

## 2020-09-09 DIAGNOSIS — Z20822 Contact with and (suspected) exposure to covid-19: Secondary | ICD-10-CM | POA: Diagnosis not present

## 2020-09-09 DIAGNOSIS — R0789 Other chest pain: Secondary | ICD-10-CM | POA: Diagnosis not present

## 2020-09-09 MED ORDER — GADOBUTROL 1 MMOL/ML IV SOLN
8.0000 mL | Freq: Once | INTRAVENOUS | Status: AC | PRN
  Administered 2020-09-09: 8 mL via INTRAVENOUS

## 2020-09-10 LAB — SARS CORONAVIRUS 2 (TAT 6-24 HRS): SARS Coronavirus 2: NEGATIVE

## 2020-09-13 ENCOUNTER — Ambulatory Visit (INDEPENDENT_AMBULATORY_CARE_PROVIDER_SITE_OTHER): Payer: PPO

## 2020-09-13 ENCOUNTER — Other Ambulatory Visit: Payer: Self-pay

## 2020-09-13 DIAGNOSIS — R0789 Other chest pain: Secondary | ICD-10-CM | POA: Diagnosis not present

## 2020-09-13 LAB — EXERCISE TOLERANCE TEST
Estimated workload: 10.1 METS
Exercise duration (min): 9 min
Exercise duration (sec): 0 s
MPHR: 194 {beats}/min
Peak HR: 173 {beats}/min
Percent HR: 89 %
RPE: 15
Rest HR: 90 {beats}/min

## 2020-09-30 ENCOUNTER — Other Ambulatory Visit (HOSPITAL_COMMUNITY): Payer: PPO

## 2021-04-14 ENCOUNTER — Ambulatory Visit: Payer: Self-pay | Admitting: *Deleted

## 2021-04-14 NOTE — Telephone Encounter (Signed)
Called patient to review symptoms. Patient reports she has been tired and c/o weakness, bones ache, like she can not "flex", difficulty opening water bottle. C/o dizziness when standing not now . C/o when standing sees "black 5 seconds and then goes back to normal".denies chest pain, difficulty breathing, no fever.  C/o symptoms x 2 -3 weeks. C/o burning during urination, vaginal itching, frequent urination and feels like not emptying . Reports dark colored urine when she wakes up  but normal color through out the day. Symptoms noted x 2 weeks .Drinks approx. 2 L of water a day . Perspiring , sweating reported. Did not report increased hunger , no blurred vision or visual changes. Patient reports she was taking prenatal vitamins to feel better. Patient is not pregnant. Patient has no PCP . Recommended patient go to ED for evaluation  due to symptoms of UTI. Encouraged patient if she did not want to go to ED try May Chart e visit . Stressed importance of getting seen or evaluated by provider before this weekend. Care advise given. Patient verbalized understanding of care adivse  and to call back or go to [facility] or ED if symptoms worsen or call 911.    Reason for Disposition  [1] MILD weakness (i.e., does not interfere with ability to work, go to school, normal activities) AND [2] persists > 1 week  Urinating more frequently than usual (i.e., frequency)  Answer Assessment - Initial Assessment Questions 1. DESCRIPTION: "Describe how you are feeling."     Weakness now , dizzy when standing  2. SEVERITY: "How bad is it?"  "Can you stand and walk?"   - MILD - Feels weak or tired, but does not interfere with work, school or normal activities   - MODERATE - Able to stand and walk; weakness interferes with work, school, or normal activities   - SEVERE - Unable to stand or walk     Mild  3. ONSET:  "When did the weakness begin?"     2 weeks ago  4. CAUSE: "What do you think is causing the weakness?"     No sure   5. MEDICINES: "Have you recently started a new medicine or had a change in the amount of a medicine?"     no 6. OTHER SYMPTOMS: "Do you have any other symptoms?" (e.g., chest pain, fever, cough, SOB, vomiting, diarrhea, bleeding, other areas of pain)     Dizziness standing, "bones not flexible", frequent urination, burning urinating , feels like not emptying  7. PREGNANCY: "Is there any chance you are pregnant?" "When was your last menstrual period?"     No  Answer Assessment - Initial Assessment Questions 1. SYMPTOM: "What's the main symptom you're concerned about?" (e.g., frequency, incontinence)     Burning, frequency, retention 2. ONSET: "When did the  symptoms  start?"     2 weeks ago  3. PAIN: "Is there any pain?" If Yes, ask: "How bad is it?" (Scale: 1-10; mild, moderate, severe)     na 4. CAUSE: "What do you think is causing the symptoms?"     na 5. OTHER SYMPTOMS: "Do you have any other symptoms?" (e.g., fever, flank pain, blood in urine, pain with urination)     Burning urinating, vaginal itching, no discharge, frequency , feels like not emptying, perspiring, sweating  urine dark in color an am  .  6. PREGNANCY: "Is there any chance you are pregnant?" "When was your last menstrual period?"     na  Protocols used: Weakness (Generalized) and Fatigue-A-AH, Urinary Symptoms-A-AH

## 2021-04-14 NOTE — Telephone Encounter (Signed)
Patient states she feels tired and...  Called patient to review symptoms. No answer, voicemail box has not been set up at this time. Unable to leave message.

## 2021-04-24 ENCOUNTER — Encounter: Payer: Self-pay | Admitting: Family

## 2021-04-24 ENCOUNTER — Ambulatory Visit (INDEPENDENT_AMBULATORY_CARE_PROVIDER_SITE_OTHER): Payer: PPO | Admitting: Family

## 2021-04-24 ENCOUNTER — Other Ambulatory Visit: Payer: Self-pay

## 2021-04-24 VITALS — BP 84/60 | HR 86 | Temp 98.7°F | Ht 61.5 in | Wt 117.0 lb

## 2021-04-24 DIAGNOSIS — R300 Dysuria: Secondary | ICD-10-CM

## 2021-04-24 DIAGNOSIS — I959 Hypotension, unspecified: Secondary | ICD-10-CM

## 2021-04-24 DIAGNOSIS — Z7689 Persons encountering health services in other specified circumstances: Secondary | ICD-10-CM

## 2021-04-24 DIAGNOSIS — R5383 Other fatigue: Secondary | ICD-10-CM

## 2021-04-24 DIAGNOSIS — K5900 Constipation, unspecified: Secondary | ICD-10-CM

## 2021-04-24 DIAGNOSIS — N920 Excessive and frequent menstruation with regular cycle: Secondary | ICD-10-CM

## 2021-04-24 LAB — COMPREHENSIVE METABOLIC PANEL
ALT: 11 U/L (ref 0–35)
AST: 16 U/L (ref 0–37)
Albumin: 4.2 g/dL (ref 3.5–5.2)
Alkaline Phosphatase: 36 U/L — ABNORMAL LOW (ref 39–117)
BUN: 11 mg/dL (ref 6–23)
CO2: 26 mEq/L (ref 19–32)
Calcium: 9.3 mg/dL (ref 8.4–10.5)
Chloride: 106 mEq/L (ref 96–112)
Creatinine, Ser: 0.52 mg/dL (ref 0.40–1.20)
GFR: 127.97 mL/min (ref >60.00)
Glucose, Bld: 84 mg/dL (ref 70–99)
Potassium: 4.2 mEq/L (ref 3.5–5.1)
Sodium: 141 mEq/L (ref 135–145)
Total Bilirubin: 0.5 mg/dL (ref 0.2–1.2)
Total Protein: 7.2 g/dL (ref 6.0–8.3)

## 2021-04-24 LAB — POCT URINALYSIS DIPSTICK
Bilirubin, UA: NEGATIVE
Blood, UA: NEGATIVE
Glucose, UA: NEGATIVE
Ketones, UA: NEGATIVE
Leukocytes, UA: NEGATIVE
Nitrite, UA: POSITIVE
Protein, UA: POSITIVE — AB
Spec Grav, UA: >=1.03 — AB (ref 1.010–1.025)
Urobilinogen, UA: 0.2 E.U./dL
pH, UA: 6 (ref 5.0–8.0)

## 2021-04-24 LAB — CBC WITH DIFFERENTIAL/PLATELET
Basophils Absolute: 0.1 10*3/uL (ref 0.0–0.1)
Basophils Relative: 1.9 % (ref 0.0–3.0)
Eosinophils Absolute: 0.1 10*3/uL (ref 0.0–0.7)
Eosinophils Relative: 2.3 % (ref 0.0–5.0)
HCT: 35.7 % — ABNORMAL LOW (ref 36.0–46.0)
Hemoglobin: 11.2 g/dL — ABNORMAL LOW (ref 12.0–15.0)
Lymphocytes Relative: 36.6 % (ref 12.0–46.0)
Lymphs Abs: 1.1 10*3/uL (ref 0.7–4.0)
MCHC: 31.4 g/dL (ref 30.0–36.0)
MCV: 81.4 fl (ref 78.0–100.0)
Monocytes Absolute: 0.3 10*3/uL (ref 0.1–1.0)
Monocytes Relative: 9.9 % (ref 3.0–12.0)
Neutro Abs: 1.5 10*3/uL (ref 1.4–7.7)
Neutrophils Relative %: 49.3 % (ref 43.0–77.0)
Platelets: 281 10*3/uL (ref 150.0–400.0)
RBC: 4.39 Mil/uL (ref 3.87–5.11)
RDW: 15 % (ref 11.5–15.5)
WBC: 3.1 10*3/uL — ABNORMAL LOW (ref 4.0–10.5)

## 2021-04-24 LAB — VITAMIN B12: Vitamin B-12: 771 pg/mL (ref 211–911)

## 2021-04-24 LAB — VITAMIN D 25 HYDROXY (VIT D DEFICIENCY, FRACTURES): VITD: 14.02 ng/mL — ABNORMAL LOW (ref 30.00–100.00)

## 2021-04-24 NOTE — Assessment & Plan Note (Signed)
Believe related to her hypotension, possibly heavy menses, checking labs. Denies poor sleep or depression. Is very busy taking care of 2 small children and going to graduate school full time.

## 2021-04-24 NOTE — Assessment & Plan Note (Signed)
W/pressure, frequency, U/A neg.

## 2021-04-24 NOTE — Progress Notes (Signed)
New Patient Office Visit  Subjective:  Patient ID: Alice [name], female    DOB: 1996-05-16  Age: 25 y.o. MRN: [id number]  CC:  Chief Complaint  Patient presents with   Establish Care   Fatigue    All symptoms started 3 weeks ago.    Constipation   Menorrhagia    HPI Alice [name] presents to establish care and discuss a few acute problems. Fatigue Pt. reports onset: 3 months ago Hx of fatigue: yes- after childbirth x 2. Occurs upon awakening  yes; during the day; evenings. Hours of sleep: 6 Works during: none - but has 2 small children, going to school and studying late. New medications or change in current med doses: no Recent labs: no Exercise: no High carb/sugar diet: no Sx of depression: no  Irregular Menstruation: Patient complains of irregular menses. Patient's last menstrual period was 03/27/2021 (approximate). Periods are lasting  10-14 days.  Missed periods: no. Dysmenorrhea: yes Menstrual symptoms include heavy, constipation. Current contraception: IUD. Paragard. History of abnormal Pap smear: no  Constipation: Patient complains of constipation. Onset: 3 mos ago Stool pattern has been 1 per week, hard/balls, no blood Straining with bowel movement: yes, sometimes. New diet: no Exercise: no Proper water intake: yes, but not every day  Past Medical History:  Diagnosis Date   Atrophic vaginitis    Burning in the chest    Chest wall pain    Constipation    Dyspareunia, female    Dysuria    Indication for care in labor or delivery 11/18/2019   Maternal anemia, with delivery 11/14/2017   Medical history non-contributory    Postcoital bleeding    PP care - NSVB 6/9 11/13/2017   Second-degree perineal laceration, with delivery 11/13/2017   SVD (spontaneous vaginal delivery) 11/13/2017   Vaginal discharge    Vaginal pain    Yeast infection of nipple, postpartum     Past Surgical History:  Procedure Laterality Date   DIAGNOSTIC LAPAROSCOPY WITH REMOVAL OF ECTOPIC  PREGNANCY     WISDOM TOOTH EXTRACTION      Family History  Problem Relation Age of Onset   Diabetes Maternal Grandmother    Diabetes Paternal Grandmother     Social History   Socioeconomic History   Marital status: Married    Spouse name: Personal assistant   Number of children: Not on file   Years of education: Not on file   Highest education level: Not on file  Occupational History   Not on file  Tobacco Use   Smoking status: Never   Smokeless tobacco: Never  Vaping Use   Vaping Use: Never used  Substance and Sexual Activity   Alcohol use: Never   Drug use: Never   Sexual activity: Not Currently  Other Topics Concern   Not on file  Social History Narrative   Not on file   Social Determinants of Health   Financial Resource Strain: Not on file  Food Insecurity: Not on file  Transportation Needs: Not on file  Physical Activity: Not on file  Stress: Not on file  Social Connections: Not on file  Intimate Partner Violence: Not on file    Objective:   Today's Vitals: BP (!) 84/60   Pulse 86   Temp 98.7 F (37.1 C) (Temporal)   Ht 5' 1.5" (1.562 m)   Wt 117 lb (53.1 kg)   LMP 03/27/2021 (Approximate)   SpO2 99%   Breastfeeding No   BMI 21.75 kg/m   Physical Exam Vitals  and nursing note reviewed.  Constitutional:      Appearance: Normal appearance.  Cardiovascular:     Rate and Rhythm: Normal rate and regular rhythm.  Pulmonary:     Effort: Pulmonary effort is normal.     Breath sounds: Normal breath sounds.  Musculoskeletal:        General: Normal range of motion.  Skin:    General: Skin is warm and dry.  Neurological:     Mental Status: She is alert.  Psychiatric:        Mood and Affect: Mood normal.        Behavior: Behavior normal.    Assessment & Plan:   Problem List Items Addressed This Visit       Cardiovascular and Mediastinum   Hypotension    With weakness and fatigue, c/o menses fairly heavy lasting 2 weeks most months, checking labs today.  Reviewed importance of eating throughout the day and hydration daily        Other   Encounter to establish care - Primary    Originally from [place]      Constipation    Not eating as many high fiber foods. Pt is very busy and forgets to drink enough. Discussed increasing water and eating high fiber foods. Informational handout provided.      Fatigue    Believe related to her hypotension, possibly heavy menses, checking labs. Denies poor sleep or depression. Is very busy taking care of 2 small children and going to graduate school full time.      Relevant Orders   Comp Met (CMET) (Completed)   CBC with Differential/Platelet (Completed)   Thyroid Panel With TSH   VITAMIN D 25 Hydroxy (Vit-D Deficiency, Fractures) (Completed)   Vitamin B12 (Completed)   Iron, TIBC and Ferritin Panel   Dysuria    W/pressure, frequency, U/A neg.      Relevant Orders   POCT Urinalysis Dipstick (Completed)   Menorrhagia with regular cycle    Has a Paragard IUD placed about a year ago after her 2nd child, reports monthly menses lasting about 2 weeks most months, checking CBC today. Denies increased pain or heavy bleeding every day of her cycle, mostly in the first week.      Relevant Orders   Iron, TIBC and Ferritin Panel    Outpatient Encounter Medications as of 04/24/2021  Medication Sig   acetaminophen (TYLENOL) 500 MG tablet Take 500 mg by mouth every 6 (six) hours as needed for mild pain or headache.   [DISCONTINUED] Cholecalciferol (VITAMIN D3) 10 MCG (400 UNIT) tablet Take 1 tablet by mouth daily. (Patient not taking: Reported on 04/24/2021)   [DISCONTINUED] coconut oil OIL Apply 1 application topically as needed. (Patient not taking: Reported on 04/24/2021)   [DISCONTINUED] Prenat-FeCbn-FeAsp-Meth-FA-DHA (PRENATE MINI) 18-0.6-0.4-350 MG CAPS Take 1 tablet by mouth daily. (Patient not taking: Reported on 04/24/2021)   No facility-administered encounter medications on file as of  04/24/2021.    Follow-up: Return in about 4 weeks (around 05/22/2021) for discuss fatigue, low blood pressure.   Jeanie Sewer, NP

## 2021-04-24 NOTE — Assessment & Plan Note (Signed)
With weakness and fatigue, c/o menses fairly heavy lasting 2 weeks most months, checking labs today. Reviewed importance of eating throughout the day and hydration daily

## 2021-04-24 NOTE — Assessment & Plan Note (Addendum)
Not eating as many high fiber foods. Pt is very busy and forgets to drink enough. Discussed increasing water and eating high fiber foods. Informational handout provided.

## 2021-04-24 NOTE — Assessment & Plan Note (Signed)
Originally from [place]

## 2021-04-24 NOTE — Assessment & Plan Note (Signed)
Has a Paragard IUD placed about a year ago after her 2nd child, reports monthly menses lasting about 2 weeks most months, checking CBC today. Denies increased pain or heavy bleeding every day of her cycle, mostly in the first week.

## 2021-04-24 NOTE — Patient Instructions (Signed)
Welcome to [facility] at [facility] [place]! It was a pleasure meeting you today.  As discussed, Please schedule a 1 month follow up visit today.  PLEASE NOTE:  If you had any LAB tests please let us know if you have not heard back within a few days. You may see your results on MyChart before we have a chance to review them but we will give you a call once they are reviewed by us. If we ordered any REFERRALS today, please let us know if you have not heard from their office within the next week.  Let us know through MyChart if you are needing REFILLS, or have your pharmacy send us the request. You can also use MyChart to communicate with me or any office staff.  Please try these tips to maintain a healthy lifestyle:  Eat most of your calories during the day when you are active. Eliminate processed foods including packaged sweets (pies, cakes, cookies), reduce intake of potatoes, white bread, white pasta, and white rice. Look for whole grain options, oat flour or almond flour.  Each meal should contain half fruits/vegetables, one quarter protein, and one quarter carbs (no bigger than a computer mouse).  Cut down on sweet beverages. This includes juice, soda, and sweet tea. Also watch fruit intake, though this is a healthier sweet option, it still contains natural sugar! Limit to 3 servings daily.  Drink at least 1 glass of water with each meal and aim for at least 8 glasses per day  Exercise at least 150 minutes every week.   

## 2021-04-25 ENCOUNTER — Other Ambulatory Visit: Payer: Self-pay | Admitting: Family

## 2021-04-25 DIAGNOSIS — E559 Vitamin D deficiency, unspecified: Secondary | ICD-10-CM

## 2021-04-25 LAB — THYROID PANEL WITH TSH
Free Thyroxine Index: 2.6 (ref 1.4–3.8)
T3 Uptake: 33 % (ref 22–35)
T4, Total: 8 ug/dL (ref 5.1–11.9)
TSH: 1.45 mIU/L

## 2021-04-25 LAB — IRON,TIBC AND FERRITIN PANEL
%SAT: 16 % (calc) (ref 16–45)
Ferritin: 4 ng/mL — ABNORMAL LOW (ref 16–154)
Iron: 63 ug/dL (ref 40–190)
TIBC: 390 mcg/dL (calc) (ref 250–450)

## 2021-04-25 MED ORDER — VITAMIN D3 1.25 MG (50000 UT) PO CAPS: 1.0000 | ORAL_CAPSULE | ORAL | 0 refills | Status: DC

## 2021-05-17 ENCOUNTER — Ambulatory Visit: Payer: PPO | Admitting: Family

## 2021-05-21 ENCOUNTER — Other Ambulatory Visit: Payer: Self-pay | Admitting: Family

## 2021-05-21 DIAGNOSIS — E559 Vitamin D deficiency, unspecified: Secondary | ICD-10-CM

## 2021-05-25 ENCOUNTER — Other Ambulatory Visit: Payer: Self-pay

## 2021-05-25 ENCOUNTER — Encounter: Payer: Self-pay | Admitting: Family

## 2021-05-25 ENCOUNTER — Ambulatory Visit (INDEPENDENT_AMBULATORY_CARE_PROVIDER_SITE_OTHER): Payer: PPO | Admitting: Family

## 2021-05-25 VITALS — BP 88/56 | HR 130 | Temp 102.3°F | Ht 61.5 in | Wt 115.0 lb

## 2021-05-25 DIAGNOSIS — J029 Acute pharyngitis, unspecified: Secondary | ICD-10-CM | POA: Diagnosis not present

## 2021-05-25 DIAGNOSIS — R6889 Other general symptoms and signs: Secondary | ICD-10-CM | POA: Insufficient documentation

## 2021-05-25 LAB — POCT RAPID STREP A (OFFICE): Rapid Strep A Screen: NEGATIVE

## 2021-05-25 LAB — POC COVID19 BINAXNOW: SARS Coronavirus 2 Ag: NEGATIVE

## 2021-05-25 LAB — POCT INFLUENZA A/B
Influenza A, POC: NEGATIVE
Influenza B, POC: NEGATIVE

## 2021-05-25 MED ORDER — IBUPROFEN 600 MG PO TABS: 600.0000 mg | ORAL_TABLET | Freq: Three times a day (TID) | ORAL | 0 refills | Status: AC | PRN

## 2021-05-25 NOTE — Progress Notes (Signed)
Subjective:     Patient ID: Alice [name], female    DOB: 18-Feb-1996, 25 y.o.   MRN: [id number]  Chief Complaint  Patient presents with   Sore Throat    Symptoms started yesterday. She has taken children liquid Tylenol and Vick's.    Fever    102.3-oral taken in office.   Headache   Ear Fullness    HPI: Sore Throat: Patient complains of sore throat. Associated symptoms include headache, fevers up to 102 degrees, post nasal drip, sinus and nasal congestion, and sore throat.Onset of symptoms was 1 day ago, gradually worsening since that time. She is not drinking much. She has not had recent close exposure to someone with proven streptococcal pharyngitis.    Health Maintenance Due  Topic Date Due   HPV VACCINES (1 - 2-dose series) Never done   Hepatitis C Screening  Never done   TETANUS/TDAP  Never done   PAP-Cervical Cytology Screening  Never done   PAP SMEAR-Modifier  Never done    Past Medical History:  Diagnosis Date   Atrophic vaginitis    Burning in the chest    Chest wall pain    Constipation    Dyspareunia, female    Dysuria    Indication for care in labor or delivery 11/18/2019   Maternal anemia, with delivery 11/14/2017   Medical history non-contributory    Postcoital bleeding    PP care - NSVB 6/9 11/13/2017   Second-degree perineal laceration, with delivery 11/13/2017   SVD (spontaneous vaginal delivery) 11/13/2017   Vaginal discharge    Vaginal pain    Yeast infection of nipple, postpartum     Past Surgical History:  Procedure Laterality Date   DIAGNOSTIC LAPAROSCOPY WITH REMOVAL OF ECTOPIC PREGNANCY     WISDOM TOOTH EXTRACTION      Outpatient Medications Prior to Visit  Medication Sig Dispense Refill   acetaminophen (TYLENOL) 500 MG tablet Take 500 mg by mouth every 6 (six) hours as needed for mild pain or headache.     D3-50 1.25 MG (50000 UT) capsule TAKE 1 EACH BY MOUTH ONCE A WEEK. 4 capsule 1   Prenat-FeCbn-FeAsp-Meth-FA-DHA (PRENATE MINI)  18-0.6-0.4-350 MG CAPS Take 1 capsule by mouth daily.     No facility-administered medications prior to visit.    No Known Allergies      Objective:    Physical Exam Vitals and nursing note reviewed.  Constitutional:      Appearance: Normal appearance.  HENT:     Right Ear: There is impacted cerumen.     Left Ear: There is impacted cerumen.     Nose:     Right Sinus: No frontal sinus tenderness.     Left Sinus: No frontal sinus tenderness.     Mouth/Throat:     Mouth: Mucous membranes are moist.     Pharynx: Posterior oropharyngeal erythema present. No pharyngeal swelling or oropharyngeal exudate.     Tonsils: No tonsillar exudate or tonsillar abscesses.  Cardiovascular:     Rate and Rhythm: Normal rate and regular rhythm.  Pulmonary:     Effort: Pulmonary effort is normal.     Breath sounds: Normal breath sounds.  Musculoskeletal:        General: Normal range of motion.  Skin:    General: Skin is warm and dry.  Neurological:     Mental Status: She is alert.  Psychiatric:        Mood and Affect: Mood normal.  Behavior: Behavior normal.    BP (!) 88/56    Pulse (!) 130    Temp (!) 102.3 F (39.1 C) (Oral)    Ht 5' 1.5" (1.562 m)    Wt 115 lb (52.2 kg)    SpO2 98%    BMI 21.38 kg/m  Wt Readings from Last 3 Encounters:  05/25/21 115 lb (52.2 kg)  04/24/21 117 lb (53.1 kg)  08/17/20 114 lb 6.4 oz (51.9 kg)       Assessment & Plan:   Problem List Items Addressed This Visit       Other   Flu-like symptoms - Primary    Covid, flu, and strep neg. Sent RX Ibuprofen for sore throat and fever, advised on use & SE, Continue OTC sinus/cold medications including generic Sudafed, Claritin D, or Mucinex per box directions. Increase water intake to at least 2 liters daily.        Relevant Orders   POCT Influenza A/B (Completed)   POC COVID-19 (Completed)   Sore throat   Relevant Medications   ibuprofen (ADVIL) 600 MG tablet   Other Relevant Orders   POCT  rapid strep A (Completed)    Meds ordered this encounter  Medications   ibuprofen (ADVIL) 600 MG tablet    Sig: Take 1 tablet (600 mg total) by mouth every 8 (eight) hours as needed.    Dispense:  30 tablet    Refill:  0    Order Specific Question:   Supervising Provider    Answer:   ANDY, CAMILLE L [[id number]]

## 2021-05-25 NOTE — Patient Instructions (Signed)
It was very nice to see you today!  You are negative for flu, covid, and strep.  I have sent prescription strength Ibuprofen to your pharmacy to take for sore throat pain, aches, fever and sinus pressure. Can also take over the counter sinus/cold medications including generic Sudafed, Claritin D, or Mucinex per box directions,Try warm salt water gargles for your sore throat up to 3 times per day. OK to take Zinc up to 25mg  & up to 2,000mg  Vitamin C daily while having symptoms, then reduce doses to 3-4 days per week, or 1/2 dose daily to boost immunity.  Increase water intake to at least 2 liters daily.     PLEASE NOTE:  If you had any lab tests please let know if you have not heard back within a few days. You may see your results on MyChart before we have a chance to review them but we will give you a call once they are reviewed by [place]. If we ordered any referrals today, please let us know if you have not heard from their office within the next week.   Please try these tips to maintain a healthy lifestyle:  Eat most of your calories during the day when you are active. Eliminate processed foods including packaged sweets (pies, cakes, cookies), reduce intake of potatoes, white bread, white pasta, and white rice. Look for whole grain options, oat flour or almond flour.  Each meal should contain half fruits/vegetables, one quarter protein, and one quarter carbs (no bigger than a computer mouse).  Cut down on sweet beverages. This includes juice, soda, and sweet tea. Also watch fruit intake, though this is a healthier sweet option, it still contains natural sugar! Limit to 3 servings daily.  Drink at least 1 glass of water with each meal and aim for at least 8 glasses per day  Exercise at least 150 minutes every week.

## 2021-05-25 NOTE — Assessment & Plan Note (Signed)
Covid, flu, and strep neg. Sent RX Ibuprofen for sore throat and fever, advised on use & SE, Continue OTC sinus/cold medications including generic Sudafed, Claritin D, or Mucinex per box directions. Increase water intake to at least 2 liters daily.

## 2021-06-14 ENCOUNTER — Other Ambulatory Visit: Payer: Self-pay | Admitting: Family Medicine

## 2021-06-14 DIAGNOSIS — E559 Vitamin D deficiency, unspecified: Secondary | ICD-10-CM

## 2022-03-05 ENCOUNTER — Encounter: Payer: Self-pay | Admitting: *Deleted

## 2022-05-24 ENCOUNTER — Encounter: Payer: Self-pay | Admitting: *Deleted

## 2022-12-20 IMAGING — MR MR CARD MORPHOLOGY WO/W CM
45 of 48 series · 45 of 48 positions shown · IV contrast (Gadavist)
Comparison: none

CLINICAL DATA: Concern for myopericarditis

EXAM:
CARDIAC MRI
TECHNIQUE: The patient was scanned on a 1.5 Tesla GE magnet. A dedicated
cardiac coil was used. Functional imaging was done using Fiesta
sequences. [DATE], and 4 chamber views were done to assess for RWMA's.
Modified Bere rule using a short axis stack was used to
calculate an ejection fraction on a dedicated work station using
Circle software. The patient received 7 cc of Gadavist. After 10
minutes inversion recovery sequences were used to assess for
infiltration and scar tissue.

[Series 4: t2_haste_db_tra_bh · axial · 8.0mm · 1.41mm/px · 1 of 14 slices shown]
[im 1/14]
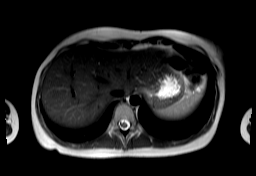

[Series 8: bSSFP · oblique · 8.0mm · 1.61mm/px · 1 of 25 slices shown (1 of 18)]
[im 1/25]
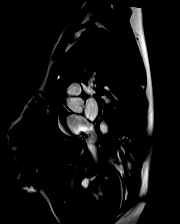

[Series 9: bSSFP · oblique · 8.0mm · 1.61mm/px · 1 of 25 slices shown (2 of 18)]
[im 1/25]
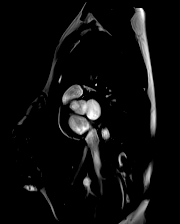

[Series 10: bSSFP · oblique · 8.0mm · 1.61mm/px · 1 of 25 slices shown (3 of 18)]
[im 1/25]
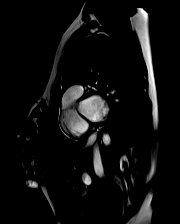

[Series 11: bSSFP · oblique · 8.0mm · 1.61mm/px · 1 of 25 slices shown (4 of 18)]
[im 1/25]
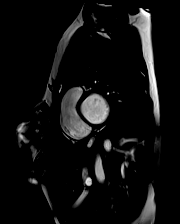

[Series 12: bSSFP · oblique · 8.0mm · 1.61mm/px · 1 of 25 slices shown (5 of 18)]
[im 1/25]
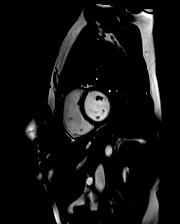

[Series 13: bSSFP · oblique · 8.0mm · 1.61mm/px · 1 of 25 slices shown (6 of 18)]
[im 1/25]
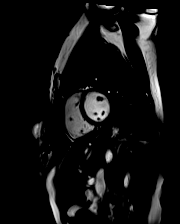

[Series 14: bSSFP · oblique · 8.0mm · 1.61mm/px · 1 of 25 slices shown (7 of 18)]
[im 1/25]
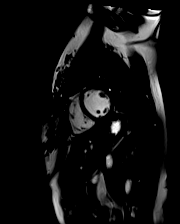

[Series 15: bSSFP · oblique · 8.0mm · 1.61mm/px · 1 of 25 slices shown (8 of 18)]
[im 1/25]
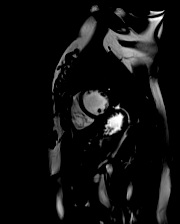

[Series 16: bSSFP · oblique · 8.0mm · 1.61mm/px · 1 of 25 slices shown (9 of 18)]
[im 1/25]
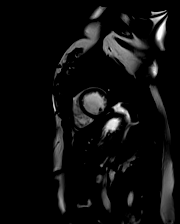

[Series 17: bSSFP · oblique · 8.0mm · 1.61mm/px · 1 of 25 slices shown (10 of 18)]
[im 1/25]
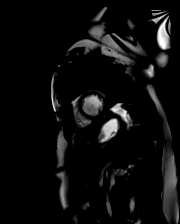

[Series 18: bSSFP · oblique · 8.0mm · 1.61mm/px · 1 of 25 slices shown (11 of 18)]
[im 1/25]
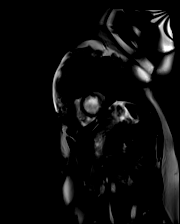

[Series 19: bSSFP · oblique · 8.0mm · 1.61mm/px · 1 of 25 slices shown (12 of 18)]
[im 1/25]
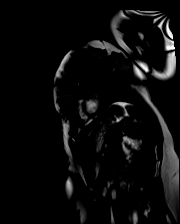

[Series 20: bSSFP · oblique · 8.0mm · 1.61mm/px · 1 of 25 slices shown (13 of 18)]
[im 1/25]
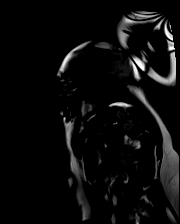

[Series 21: bSSFP · oblique · 8.0mm · 1.61mm/px · 1 of 25 slices shown (14 of 18)]
[im 1/25]
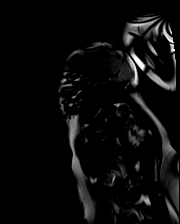

[Series 22: bSSFP · oblique · 8.0mm · 1.61mm/px · 1 of 25 slices shown (15 of 18)]
[im 1/25]
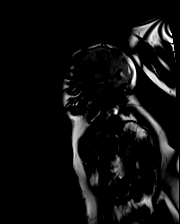

[Series 23: STIR · oblique · 8.0mm · 1.92mm/px · 1 of 16 slices shown (1 of 4)]
[im 1/16]
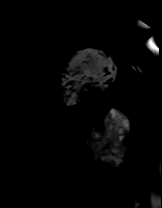

[Series 24: STIR · oblique · 8.0mm · 1.92mm/px · 1 of 9 slices shown (2 of 4)]
[im 1/9]
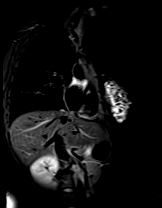

[Series 25: bSSFP · oblique · 6.0mm · 1.41mm/px · 1 of 25 slices shown (16 of 18)]
[im 1/25]
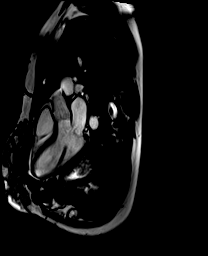

[Series 26: STIR · oblique · 8.0mm · 1.92mm/px · 1 of 10 slices shown (3 of 4)]
[im 1/10]
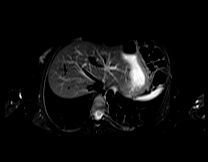

[Series 27: STIR · oblique · 8.0mm · 1.92mm/px · 1 of 10 slices shown (4 of 4)]
[im 1/10]
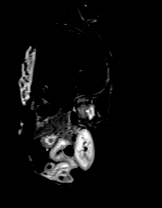

[Series 28: 4 ch axial_db_haste · oblique · 8.0mm · 1.48mm/px · 1 of 10 slices shown (1 of 2)]
[im 1/10]
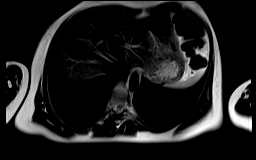

[Series 29: 4 ch axial_db_haste · oblique · 8.0mm · 1.48mm/px · 1 of 10 slices shown (2 of 2)]
[im 1/10]
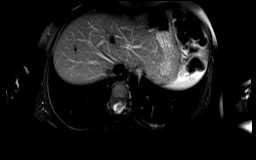

[Series 31: sax axial_db_haste fs · oblique · 8.0mm · 1.48mm/px · 1 of 10 slices shown]
[im 1/10]
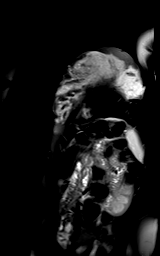

[Series 32: sax axial_db_haste · oblique · 8.0mm · 1.48mm/px · 1 of 10 slices shown]
[im 1/10]
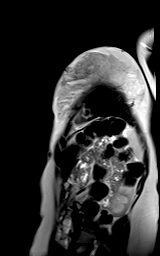

[Series 33: 3ch axial_db_haste · oblique · 8.0mm · 1.48mm/px · 1 of 10 slices shown]
[im 1/10]
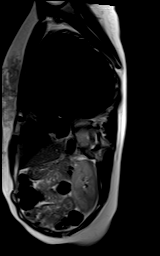

[Series 34: 3ch axial_db_haste fs · oblique · 8.0mm · 1.48mm/px · 1 of 10 slices shown]
[im 1/10]
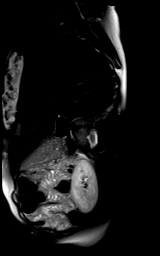

[Series 35: 2ch axial_db_haste · oblique · 8.0mm · 1.48mm/px · 1 of 9 slices shown]
[im 1/9]
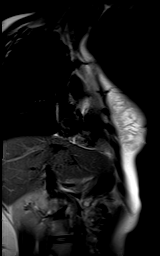

[Series 36: 2ch axial_db_haste fs · oblique · 8.0mm · 1.48mm/px · 1 of 9 slices shown]
[im 1/9]
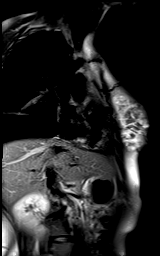

[Series 37: (id)_long_t1 · oblique · 8.0mm · 1.56mm/px · 1 of 24 slices shown]
[im 1/24]
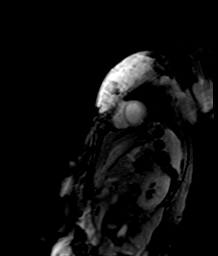

[Series 38: (id)_long_t1_moco · oblique · 8.0mm · 1.56mm/px · 1 of 24 slices shown]
[im 1/24]
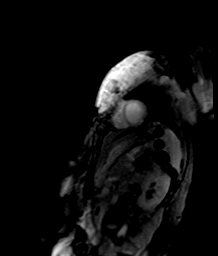

[Series 39: (id)_long_t1_moco_t1 · oblique · 8.0mm · 1.56mm/px · 1 of 3 slices shown]
[im 1/3]
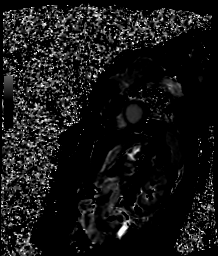

[Series 41: (id)_trufi · oblique · 8.0mm · 2.08mm/px · 1 of 9 slices shown]
[im 1/9]
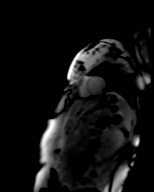

[Series 42: (id)_trufi_moco · oblique · 8.0mm · 2.08mm/px · 1 of 9 slices shown]
[im 1/9]
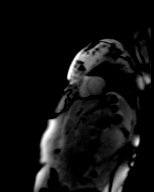

[Series 43: (id)_trufi_moco_t2 · oblique · 8.0mm · 2.08mm/px · 1 of 3 slices shown]
[im 1/3]
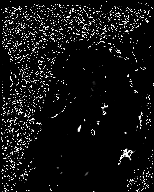

[Series 45: bSSFP · oblique · 6.0mm · 1.41mm/px · 1 of 25 slices shown (17 of 18)]
[im 1/25]
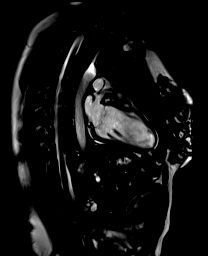

[Series 46: bSSFP · oblique · 6.0mm · 1.41mm/px · 1 of 25 slices shown (18 of 18)]
[im 1/25]
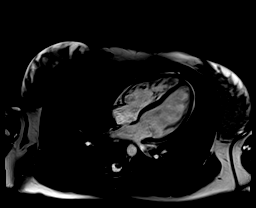

[Series 47: pre short axis · oblique · non-contrast · 8.0mm · 2.25mm/px · 1 of 10 slices shown (1 of 6)]
[im 1/10]
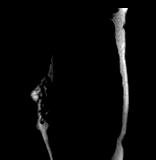

[Series 48: pre short axis · oblique · non-contrast · 8.0mm · 2.25mm/px · 1 of 10 slices shown (2 of 6)]
[im 1/10]
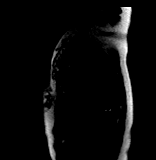

[Series 49: pre short axis · oblique · non-contrast · 8.0mm · 2.25mm/px · 1 of 10 slices shown (3 of 6)]
[im 1/10]
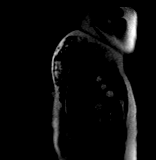

[Series 50: pre short axis · oblique · non-contrast · 8.0mm · 2.25mm/px · 1 of 10 slices shown (4 of 6)]
[im 1/10]
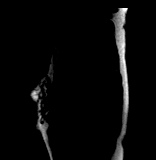

[Series 51: pre short axis · oblique · non-contrast · 8.0mm · 2.25mm/px · 1 of 10 slices shown (5 of 6)]
[im 1/10]
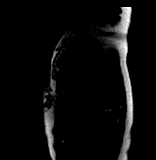

[Series 52: pre short axis · oblique · non-contrast · 8.0mm · 2.25mm/px · 1 of 10 slices shown (6 of 6)]
[im 1/10]
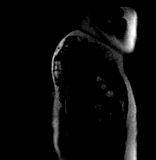

[Series 53: rest short axis · oblique · 8.0mm · 2.25mm/px · 1 of 60 slices shown (1 of 2)]
[im 1/60]
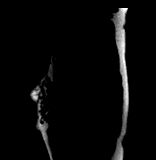

[Series 54: rest short axis · oblique · 8.0mm · 2.25mm/px · 1 of 60 slices shown (2 of 2)]
[im 1/60]
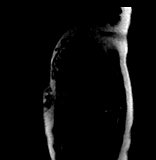

[45 of 48 positions shown; findings below may reference images not displayed]

FINDINGS: Limited images of the lung fields showed no gross abnormalities.

Normal left ventricular size and wall thickness. Normal wall motion
with EF 61%. Normal right ventricular size and systolic function, EF
54%. Normal right and left atrial sizes. No significant mitral
regurgitation. Trileaflet aortic valve with no significant
regurgitation or stenosis.

Delayed enhancement images showed no myocardial late gadolinium
enhancement (LGE).

Measurements:

LVEDV 109 mL

LVSV 66 mL
LVEF 61%

RVEDV 105 mL
RVSV 57 mL
RVEF 54%

T2 < 56
IMPRESSION: 1. Normal LV size and systolic function, EF 61%. Normal wall motion.

2.  Normal RV size and systolic function, EF 54%.

3.  T2 signal does not appear enhanced.

4. No myocardial LGE, so no definitive evidence for prior MI,
infiltrative disease, or myocarditis.

Normal study.

Bartouli Kiri

## 2023-01-02 ENCOUNTER — Encounter: Payer: Self-pay | Admitting: Family

## 2023-01-02 ENCOUNTER — Telehealth: Payer: Self-pay | Admitting: Family

## 2023-01-02 NOTE — Telephone Encounter (Signed)
Attempted to contact pt to schedule for overdue cpe . Letter sent to address on file .
